# Patient Record
Sex: Male | Born: 1960 | Race: White | Hispanic: No | Smoking: Former smoker
Health system: Southern US, Community
[De-identification: ages and names within clinical notes are randomized; demographics above are authoritative.]

## PROBLEM LIST (undated history)

## (undated) DIAGNOSIS — I7101 Dissection of thoracic aorta: Secondary | ICD-10-CM

## (undated) DIAGNOSIS — I10 Essential (primary) hypertension: Secondary | ICD-10-CM

## (undated) DIAGNOSIS — Z8709 Personal history of other diseases of the respiratory system: Secondary | ICD-10-CM

## (undated) DIAGNOSIS — R1310 Dysphagia, unspecified: Secondary | ICD-10-CM

## (undated) DIAGNOSIS — H269 Unspecified cataract: Secondary | ICD-10-CM

## (undated) HISTORY — DX: Unspecified cataract: H26.9

## (undated) HISTORY — DX: Personal history of other diseases of the respiratory system: Z87.09

## (undated) HISTORY — PX: EYE SURGERY: SHX253

## (undated) HISTORY — DX: Dysphagia, unspecified: R13.10

---
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
## 2019-03-24 ENCOUNTER — Emergency Department (HOSPITAL_COMMUNITY): Payer: Self-pay | Admitting: Certified Registered Nurse Anesthetist

## 2019-03-24 ENCOUNTER — Emergency Department (HOSPITAL_COMMUNITY): Payer: Self-pay

## 2019-03-24 ENCOUNTER — Inpatient Hospital Stay (HOSPITAL_COMMUNITY): Payer: Self-pay

## 2019-03-24 ENCOUNTER — Inpatient Hospital Stay: Admit: 2019-03-24 | Payer: Self-pay | Admitting: Thoracic Surgery (Cardiothoracic Vascular Surgery)

## 2019-03-24 ENCOUNTER — Inpatient Hospital Stay (HOSPITAL_COMMUNITY): Payer: Self-pay | Admitting: Anesthesiology

## 2019-03-24 ENCOUNTER — Inpatient Hospital Stay (HOSPITAL_COMMUNITY)
Admission: EM | Admit: 2019-03-24 | Discharge: 2019-04-02 | DRG: 220 | Disposition: A | Discharge status: Home / Self Care | Payer: Self-pay | Attending: Thoracic Surgery (Cardiothoracic Vascular Surgery) | Admitting: Thoracic Surgery (Cardiothoracic Vascular Surgery)

## 2019-03-24 ENCOUNTER — Other Ambulatory Visit: Payer: Self-pay

## 2019-03-24 ENCOUNTER — Encounter (HOSPITAL_COMMUNITY)
Admission: EM | Disposition: A | Discharge status: Home / Self Care | Payer: Self-pay | Source: Home / Self Care | Attending: Emergency Medicine

## 2019-03-24 ENCOUNTER — Encounter (HOSPITAL_COMMUNITY)
Admission: EM | Disposition: A | Discharge status: Home / Self Care | Payer: Self-pay | Source: Home / Self Care | Attending: Thoracic Surgery (Cardiothoracic Vascular Surgery)

## 2019-03-24 ENCOUNTER — Emergency Department (HOSPITAL_COMMUNITY)
Admission: EM | Admit: 2019-03-24 | Discharge: 2019-03-24 | Disposition: A | Discharge status: Home / Self Care | Payer: Self-pay | Attending: Emergency Medicine | Admitting: Emergency Medicine

## 2019-03-24 ENCOUNTER — Encounter (HOSPITAL_COMMUNITY): Payer: Self-pay | Admitting: Emergency Medicine

## 2019-03-24 DIAGNOSIS — I7103 Dissection of thoracoabdominal aorta: Secondary | ICD-10-CM

## 2019-03-24 DIAGNOSIS — D62 Acute posthemorrhagic anemia: Secondary | ICD-10-CM | POA: Diagnosis not present

## 2019-03-24 DIAGNOSIS — Z6833 Body mass index (BMI) 33.0-33.9, adult: Secondary | ICD-10-CM

## 2019-03-24 DIAGNOSIS — E8770 Fluid overload, unspecified: Secondary | ICD-10-CM | POA: Diagnosis present

## 2019-03-24 DIAGNOSIS — E876 Hypokalemia: Secondary | ICD-10-CM | POA: Diagnosis not present

## 2019-03-24 DIAGNOSIS — R079 Chest pain, unspecified: Secondary | ICD-10-CM

## 2019-03-24 DIAGNOSIS — Z87442 Personal history of urinary calculi: Secondary | ICD-10-CM

## 2019-03-24 DIAGNOSIS — Z8249 Family history of ischemic heart disease and other diseases of the circulatory system: Secondary | ICD-10-CM

## 2019-03-24 DIAGNOSIS — I119 Hypertensive heart disease without heart failure: Secondary | ICD-10-CM | POA: Diagnosis present

## 2019-03-24 DIAGNOSIS — I7101 Dissection of thoracic aorta: Secondary | ICD-10-CM

## 2019-03-24 DIAGNOSIS — K5900 Constipation, unspecified: Secondary | ICD-10-CM | POA: Diagnosis present

## 2019-03-24 DIAGNOSIS — I71019 Dissection of thoracic aorta, unspecified: Secondary | ICD-10-CM | POA: Diagnosis present

## 2019-03-24 DIAGNOSIS — I959 Hypotension, unspecified: Secondary | ICD-10-CM

## 2019-03-24 DIAGNOSIS — I454 Nonspecific intraventricular block: Secondary | ICD-10-CM | POA: Diagnosis present

## 2019-03-24 DIAGNOSIS — J90 Pleural effusion, not elsewhere classified: Secondary | ICD-10-CM

## 2019-03-24 DIAGNOSIS — Z9889 Other specified postprocedural states: Secondary | ICD-10-CM

## 2019-03-24 DIAGNOSIS — Z5321 Procedure and treatment not carried out due to patient leaving prior to being seen by health care provider: Secondary | ICD-10-CM | POA: Insufficient documentation

## 2019-03-24 DIAGNOSIS — K219 Gastro-esophageal reflux disease without esophagitis: Secondary | ICD-10-CM | POA: Diagnosis present

## 2019-03-24 DIAGNOSIS — Z8261 Family history of arthritis: Secondary | ICD-10-CM

## 2019-03-24 DIAGNOSIS — I7102 Dissection of abdominal aorta: Secondary | ICD-10-CM

## 2019-03-24 DIAGNOSIS — R001 Bradycardia, unspecified: Secondary | ICD-10-CM

## 2019-03-24 DIAGNOSIS — E669 Obesity, unspecified: Secondary | ICD-10-CM | POA: Diagnosis present

## 2019-03-24 DIAGNOSIS — Z79899 Other long term (current) drug therapy: Secondary | ICD-10-CM

## 2019-03-24 HISTORY — DX: Dissection of thoracic aorta, unspecified: I71.019

## 2019-03-24 HISTORY — DX: Essential (primary) hypertension: I10

## 2019-03-24 HISTORY — PX: TRANSESOPHAGEAL ECHOCARDIOGRAM: SHX273

## 2019-03-24 HISTORY — DX: Dissection of thoracic aorta: I71.01

## 2019-03-24 HISTORY — PX: THORACIC AORTIC ANEURYSM REPAIR: SHX799

## 2019-03-24 LAB — POCT I-STAT 4, (NA,K, GLUC, HGB,HCT)
Glucose, Bld: 109 mg/dL — ABNORMAL HIGH (ref 70–99)
Glucose, Bld: 113 mg/dL — ABNORMAL HIGH (ref 70–99)
Glucose, Bld: 59 mg/dL — ABNORMAL LOW (ref 70–99)
Glucose, Bld: 157 mg/dL — ABNORMAL HIGH (ref 70–99)
Glucose, Bld: 175 mg/dL — ABNORMAL HIGH (ref 70–99)
Glucose, Bld: 176 mg/dL — ABNORMAL HIGH (ref 70–99)
HCT: 18 % — ABNORMAL LOW (ref 39.0–52.0)
HCT: 33 % — ABNORMAL LOW (ref 39.0–52.0)
HCT: 40 % (ref 39.0–52.0)
HCT: 29 % — ABNORMAL LOW (ref 39.0–52.0)
HCT: 26 % — ABNORMAL LOW (ref 39.0–52.0)
HCT: 28 % — ABNORMAL LOW (ref 39.0–52.0)
Hemoglobin: 11.2 g/dL — ABNORMAL LOW (ref 13.0–17.0)
Hemoglobin: 13.6 g/dL (ref 13.0–17.0)
Hemoglobin: 6.1 g/dL — CL (ref 13.0–17.0)
Hemoglobin: 9.9 g/dL — ABNORMAL LOW (ref 13.0–17.0)
Hemoglobin: 8.8 g/dL — ABNORMAL LOW (ref 13.0–17.0)
Hemoglobin: 9.5 g/dL — ABNORMAL LOW (ref 13.0–17.0)
Potassium: 3.8 mmol/L (ref 3.5–5.1)
Potassium: 4.1 mmol/L (ref 3.5–5.1)
Potassium: 4.5 mmol/L (ref 3.5–5.1)
Potassium: 4.3 mmol/L (ref 3.5–5.1)
Potassium: 5.2 mmol/L — ABNORMAL HIGH (ref 3.5–5.1)
Potassium: 5.1 mmol/L (ref 3.5–5.1)
Sodium: 138 mmol/L (ref 135–145)
Sodium: 141 mmol/L (ref 135–145)
Sodium: 142 mmol/L (ref 135–145)
Sodium: 137 mmol/L (ref 135–145)
Sodium: 138 mmol/L (ref 135–145)
Sodium: 137 mmol/L (ref 135–145)

## 2019-03-24 LAB — CBC WITH DIFFERENTIAL/PLATELET

## 2019-03-24 LAB — I-STAT CHEM 8, ED
BUN: 12 mg/dL (ref 6–20)
Calcium, Ion: 1.06 mmol/L — ABNORMAL LOW (ref 1.15–1.40)
Chloride: 106 mmol/L (ref 98–111)
Creatinine, Ser: 1.2 mg/dL (ref 0.61–1.24)
Glucose, Bld: 106 mg/dL — ABNORMAL HIGH (ref 70–99)
HCT: 39 % (ref 39.0–52.0)
Hemoglobin: 13.3 g/dL (ref 13.0–17.0)
Potassium: 3.3 mmol/L — ABNORMAL LOW (ref 3.5–5.1)
Sodium: 143 mmol/L (ref 135–145)
TCO2: 23 mmol/L (ref 22–32)

## 2019-03-24 LAB — POCT I-STAT 7, (LYTES, BLD GAS, ICA,H+H)
Acid-base deficit: 1 mmol/L (ref 0.0–2.0)
Acid-base deficit: 8 mmol/L — ABNORMAL HIGH (ref 0.0–2.0)
Acid-base deficit: 4 mmol/L — ABNORMAL HIGH (ref 0.0–2.0)
Bicarbonate: 26.1 mmol/L (ref 20.0–28.0)
Bicarbonate: 20.4 mmol/L (ref 20.0–28.0)
Bicarbonate: 22.5 mmol/L (ref 20.0–28.0)
Calcium, Ion: 1.05 mmol/L — ABNORMAL LOW (ref 1.15–1.40)
Calcium, Ion: 1.06 mmol/L — ABNORMAL LOW (ref 1.15–1.40)
Calcium, Ion: 0.93 mmol/L — ABNORMAL LOW (ref 1.15–1.40)
HCT: 34 % — ABNORMAL LOW (ref 39.0–52.0)
HCT: 29 % — ABNORMAL LOW (ref 39.0–52.0)
HCT: 29 % — ABNORMAL LOW (ref 39.0–52.0)
Hemoglobin: 11.6 g/dL — ABNORMAL LOW (ref 13.0–17.0)
Hemoglobin: 9.9 g/dL — ABNORMAL LOW (ref 13.0–17.0)
Hemoglobin: 9.9 g/dL — ABNORMAL LOW (ref 13.0–17.0)
O2 Saturation: 100 %
O2 Saturation: 100 %
O2 Saturation: 99 %
Potassium: 3.9 mmol/L (ref 3.5–5.1)
Potassium: 4.4 mmol/L (ref 3.5–5.1)
Potassium: 5.1 mmol/L (ref 3.5–5.1)
Sodium: 142 mmol/L (ref 135–145)
Sodium: 138 mmol/L (ref 135–145)
Sodium: 137 mmol/L (ref 135–145)
TCO2: 28 mmol/L (ref 22–32)
TCO2: 22 mmol/L (ref 22–32)
TCO2: 24 mmol/L (ref 22–32)
pCO2 arterial: 55.5 mmHg — ABNORMAL HIGH (ref 32.0–48.0)
pCO2 arterial: 54.5 mmHg — ABNORMAL HIGH (ref 32.0–48.0)
pCO2 arterial: 49.2 mmHg — ABNORMAL HIGH (ref 32.0–48.0)
pH, Arterial: 7.28 — ABNORMAL LOW (ref 7.350–7.450)
pH, Arterial: 7.182 — CL (ref 7.350–7.450)
pH, Arterial: 7.267 — ABNORMAL LOW (ref 7.350–7.450)
pO2, Arterial: 338 mmHg — ABNORMAL HIGH (ref 83.0–108.0)
pO2, Arterial: 326 mmHg — ABNORMAL HIGH (ref 83.0–108.0)
pO2, Arterial: 166 mmHg — ABNORMAL HIGH (ref 83.0–108.0)

## 2019-03-24 LAB — TROPONIN I (HIGH SENSITIVITY)

## 2019-03-24 LAB — HEMOGLOBIN AND HEMATOCRIT, BLOOD
HCT: 31.4 % — ABNORMAL LOW (ref 39.0–52.0)
Hemoglobin: 10.5 g/dL — ABNORMAL LOW (ref 13.0–17.0)

## 2019-03-24 LAB — ABO/RH: ABO/RH(D): A POS

## 2019-03-24 LAB — MAGNESIUM

## 2019-03-24 LAB — BASIC METABOLIC PANEL

## 2019-03-24 LAB — PROTIME-INR

## 2019-03-24 LAB — PLATELET COUNT: Platelets: 117 10*3/uL — ABNORMAL LOW (ref 150–400)

## 2019-03-24 LAB — SARS CORONAVIRUS 2 BY RT PCR (HOSPITAL ORDER, PERFORMED IN ~~LOC~~ HOSPITAL LAB)

## 2019-03-24 LAB — FIBRINOGEN: Fibrinogen: 122 mg/dL — ABNORMAL LOW (ref 210–475)

## 2019-03-24 SURGERY — REPAIR, ANEURYSM, AORTA, THORACIC, ASCENDING
Anesthesia: General

## 2019-03-24 MED ORDER — PROTAMINE SULFATE 10 MG/ML IV SOLN
INTRAVENOUS | Status: DC | PRN
  Administered 2019-03-24: 10 mg via INTRAVENOUS
  Administered 2019-03-24: 100 mg via INTRAVENOUS
  Administered 2019-03-24: 40 mg via INTRAVENOUS
  Administered 2019-03-24 (×2): 100 mg via INTRAVENOUS

## 2019-03-24 MED ORDER — SUCCINYLCHOLINE 20MG/ML (10ML) SYRINGE FOR MEDFUSION PUMP - OPTIME
INTRAMUSCULAR | Status: DC | PRN
  Administered 2019-03-24: 200 mg via INTRAVENOUS

## 2019-03-24 MED ORDER — VANCOMYCIN HCL 10 G IV SOLR
1250.0000 mg | INTRAVENOUS | Status: DC
  Filled 2019-03-24 (×2): qty 1250

## 2019-03-24 MED ORDER — VANCOMYCIN HCL 1000 MG IV SOLR
INTRAVENOUS | Status: DC | PRN
  Administered 2019-03-24: 1500 mg via INTRAVENOUS

## 2019-03-24 MED ORDER — METHYLPREDNISOLONE SODIUM SUCC 125 MG IJ SOLR
125.0000 mg | Freq: Once | INTRAMUSCULAR | Status: DC
  Filled 2019-03-24: qty 2

## 2019-03-24 MED ORDER — TRANEXAMIC ACID 1000 MG/10ML IV SOLN
1.5000 mg/kg/h | INTRAVENOUS | Status: AC
  Administered 2019-03-24: 19:00:00 1.5 mg/kg/h via INTRAVENOUS
  Administered 2019-03-24: 22:00:00 via INTRAVENOUS
  Filled 2019-03-24: qty 25

## 2019-03-24 MED ORDER — MANNITOL 20 % IV SOLN
Freq: Once | INTRAVENOUS | Status: DC
  Filled 2019-03-24: qty 13

## 2019-03-24 MED ORDER — MIDAZOLAM HCL 5 MG/5ML IJ SOLN: INTRAMUSCULAR | Status: DC | PRN

## 2019-03-24 MED ORDER — SODIUM CHLORIDE 0.9 % IV SOLN
INTRAVENOUS | Status: DC
  Filled 2019-03-24: qty 30

## 2019-03-24 MED ORDER — SODIUM CHLORIDE 0.9 % IV SOLN
750.0000 mg | INTRAVENOUS | Status: DC
  Filled 2019-03-24 (×2): qty 750

## 2019-03-24 MED ORDER — DEXMEDETOMIDINE HCL IN NACL 400 MCG/100ML IV SOLN
0.1000 ug/kg/h | INTRAVENOUS | Status: DC
  Filled 2019-03-24: qty 100

## 2019-03-24 MED ORDER — LACTATED RINGERS IV SOLN
INTRAVENOUS | Status: DC | PRN
  Administered 2019-03-24: 18:00:00 via INTRAVENOUS

## 2019-03-24 MED ORDER — PLASMA-LYTE 148 IV SOLN
INTRAVENOUS | Status: DC
  Filled 2019-03-24: qty 2.5

## 2019-03-24 MED ORDER — LACTATED RINGERS IV SOLN: INTRAVENOUS | Status: DC | PRN

## 2019-03-24 MED ORDER — SODIUM CHLORIDE 0.9 % IV BOLUS
1000.0000 mL | Freq: Once | INTRAVENOUS | Status: AC
  Administered 2019-03-24: 1000 mL via INTRAVENOUS

## 2019-03-24 MED ORDER — MIDAZOLAM HCL 2 MG/2ML IJ SOLN
INTRAMUSCULAR | Status: DC | PRN
  Administered 2019-03-24 (×2): 2 mg via INTRAVENOUS
  Administered 2019-03-24 (×2): 1 mg via INTRAVENOUS
  Administered 2019-03-25: 2 mg via INTRAVENOUS

## 2019-03-24 MED ORDER — IOHEXOL 350 MG/ML SOLN
100.0000 mL | Freq: Once | INTRAVENOUS | Status: AC | PRN
  Administered 2019-03-24: 100 mL via INTRAVENOUS

## 2019-03-24 MED ORDER — HEMOSTATIC AGENTS (NO CHARGE) OPTIME
TOPICAL | Status: DC | PRN
  Administered 2019-03-24: 1 via TOPICAL

## 2019-03-24 MED ORDER — ROCURONIUM BROMIDE 50 MG/5ML IV SOSY: PREFILLED_SYRINGE | INTRAVENOUS | Status: DC | PRN

## 2019-03-24 MED ORDER — HEPARIN SODIUM (PORCINE) 1000 UNIT/ML IJ SOLN
INTRAMUSCULAR | Status: DC | PRN
  Administered 2019-03-24: 30000 [IU] via INTRAVENOUS
  Administered 2019-03-24: 5000 [IU] via INTRAVENOUS

## 2019-03-24 MED ORDER — DOPAMINE-DEXTROSE 3.2-5 MG/ML-% IV SOLN
0.0000 ug/kg/min | INTRAVENOUS | Status: DC
  Filled 2019-03-24: qty 250

## 2019-03-24 MED ORDER — PROPOFOL 10 MG/ML IV BOLUS
INTRAVENOUS | Status: DC | PRN
  Administered 2019-03-24: 100 mg via INTRAVENOUS

## 2019-03-24 MED ORDER — SODIUM CHLORIDE 0.9 % IV SOLN
1.5000 g | INTRAVENOUS | Status: DC
  Filled 2019-03-24: qty 1.5

## 2019-03-24 MED ORDER — ETOMIDATE 2 MG/ML IV SOLN
INTRAVENOUS | Status: DC | PRN
  Administered 2019-03-24: 16 mg via INTRAVENOUS

## 2019-03-24 MED ORDER — POTASSIUM CHLORIDE 2 MEQ/ML IV SOLN
80.0000 meq | INTRAVENOUS | Status: DC
  Filled 2019-03-24: qty 40

## 2019-03-24 MED ORDER — TRANEXAMIC ACID 1000 MG/10ML IV SOLN
1.5000 mg/kg/h | INTRAVENOUS | Status: DC
  Filled 2019-03-24: qty 25

## 2019-03-24 MED ORDER — TRANEXAMIC ACID (OHS) PUMP PRIME SOLUTION
2.0000 mg/kg | INTRAVENOUS | Status: DC
  Filled 2019-03-24: qty 2.36

## 2019-03-24 MED ORDER — MILRINONE LACTATE IN DEXTROSE 20-5 MG/100ML-% IV SOLN
0.3000 ug/kg/min | INTRAVENOUS | Status: DC
  Filled 2019-03-24: qty 100

## 2019-03-24 MED ORDER — NITROGLYCERIN IN D5W 200-5 MCG/ML-% IV SOLN
2.0000 ug/min | INTRAVENOUS | Status: DC
  Filled 2019-03-24: qty 250

## 2019-03-24 MED ORDER — EPINEPHRINE HCL 5 MG/250ML IV SOLN IN NS
0.0000 ug/min | INTRAVENOUS | Status: DC
  Filled 2019-03-24: qty 250

## 2019-03-24 MED ORDER — MIDAZOLAM HCL 2 MG/2ML IJ SOLN
INTRAMUSCULAR | Status: AC
  Filled 2019-03-24: qty 2

## 2019-03-24 MED ORDER — HEPARIN SODIUM (PORCINE) 1000 UNIT/ML IJ SOLN: INTRAMUSCULAR | Status: DC | PRN

## 2019-03-24 MED ORDER — 0.9 % SODIUM CHLORIDE (POUR BTL) OPTIME
TOPICAL | Status: DC | PRN
  Administered 2019-03-24: 5000 mL

## 2019-03-24 MED ORDER — TRANEXAMIC ACID (OHS) BOLUS VIA INFUSION
15.0000 mg/kg | INTRAVENOUS | Status: AC
  Administered 2019-03-24: 19:00:00 1768.5 mg via INTRAVENOUS
  Filled 2019-03-24: qty 1769

## 2019-03-24 MED ORDER — ROCURONIUM BROMIDE 100 MG/10ML IV SOLN
INTRAVENOUS | Status: DC | PRN
  Administered 2019-03-24: 50 mg via INTRAVENOUS
  Administered 2019-03-24: 100 mg via INTRAVENOUS
  Administered 2019-03-24: 30 mg via INTRAVENOUS

## 2019-03-24 MED ORDER — SODIUM CHLORIDE 0.9 % IV SOLN
INTRAVENOUS | Status: DC | PRN
  Administered 2019-03-24: 23:00:00 .75 g via INTRAVENOUS
  Administered 2019-03-24: 19:00:00 1.5 g via INTRAVENOUS

## 2019-03-24 MED ORDER — TRANEXAMIC ACID (OHS) BOLUS VIA INFUSION
15.0000 mg/kg | INTRAVENOUS | Status: DC
  Filled 2019-03-24: qty 1769

## 2019-03-24 MED ORDER — ETOMIDATE 2 MG/ML IV SOLN: INTRAVENOUS | Status: DC | PRN

## 2019-03-24 MED ORDER — MAGNESIUM SULFATE 50 % IJ SOLN
40.0000 meq | INTRAMUSCULAR | Status: DC
  Filled 2019-03-24: qty 9.85

## 2019-03-24 MED ORDER — DEXMEDETOMIDINE HCL IN NACL 400 MCG/100ML IV SOLN
0.1000 ug/kg/h | INTRAVENOUS | Status: AC
  Administered 2019-03-24: .2 ug/kg/h via INTRAVENOUS
  Filled 2019-03-24: qty 100

## 2019-03-24 MED ORDER — PLASMA-LYTE 148 IV SOLN
INTRAVENOUS | Status: AC
  Administered 2019-03-24: 20:00:00 500 mL
  Filled 2019-03-24: qty 2.5

## 2019-03-24 MED ORDER — ONDANSETRON HCL 4 MG/2ML IJ SOLN
INTRAMUSCULAR | Status: AC
  Administered 2019-03-24: 17:00:00 4 mg via INTRAVENOUS
  Filled 2019-03-24: qty 2

## 2019-03-24 MED ORDER — SODIUM CHLORIDE (PF) 0.9 % IJ SOLN
OROMUCOSAL | Status: DC | PRN
  Administered 2019-03-24 (×3): 4 mL via TOPICAL

## 2019-03-24 MED ORDER — INSULIN REGULAR(HUMAN) IN NACL 100-0.9 UT/100ML-% IV SOLN
INTRAVENOUS | Status: DC
  Filled 2019-03-24: qty 100

## 2019-03-24 MED ORDER — FENTANYL CITRATE (PF) 250 MCG/5ML IJ SOLN
INTRAMUSCULAR | Status: AC
  Filled 2019-03-24: qty 10

## 2019-03-24 MED ORDER — FENTANYL CITRATE (PF) 100 MCG/2ML IJ SOLN: INTRAMUSCULAR | Status: DC | PRN

## 2019-03-24 MED ORDER — PHENYLEPHRINE HCL-NACL 20-0.9 MG/250ML-% IV SOLN
30.0000 ug/min | INTRAVENOUS | Status: DC
  Filled 2019-03-24: qty 250

## 2019-03-24 MED ORDER — NITROGLYCERIN IN D5W 200-5 MCG/ML-% IV SOLN
2.0000 ug/min | INTRAVENOUS | Status: AC
  Administered 2019-03-24: 22:00:00 5 ug/min via INTRAVENOUS
  Filled 2019-03-24: qty 250

## 2019-03-24 MED ORDER — PHENYLEPHRINE HCL-NACL 20-0.9 MG/250ML-% IV SOLN
30.0000 ug/min | INTRAVENOUS | Status: DC
  Filled 2019-03-24 (×2): qty 250

## 2019-03-24 MED ORDER — FENTANYL CITRATE (PF) 250 MCG/5ML IJ SOLN
INTRAMUSCULAR | Status: DC | PRN
  Administered 2019-03-24: 150 ug via INTRAVENOUS
  Administered 2019-03-24: 100 ug via INTRAVENOUS
  Administered 2019-03-24: 225 ug via INTRAVENOUS
  Administered 2019-03-24: 100 ug via INTRAVENOUS
  Administered 2019-03-24: 150 ug via INTRAVENOUS
  Administered 2019-03-24: 125 ug via INTRAVENOUS

## 2019-03-24 MED ORDER — ATROPINE SULFATE 1 MG/10ML IJ SOSY
0.5000 mg | PREFILLED_SYRINGE | Freq: Once | INTRAMUSCULAR | Status: AC
  Administered 2019-03-24: 0.5 mg via INTRAVENOUS
  Filled 2019-03-24: qty 10

## 2019-03-24 MED ORDER — DOPAMINE-DEXTROSE 3.2-5 MG/ML-% IV SOLN: 0.0000 ug/kg/min | INTRAVENOUS | Status: DC

## 2019-03-24 MED ORDER — SUCCINYLCHOLINE CHLORIDE 200 MG/10ML IV SOSY: PREFILLED_SYRINGE | INTRAVENOUS | Status: DC | PRN

## 2019-03-24 MED ORDER — VANCOMYCIN HCL 10 G IV SOLR
1500.0000 mg | INTRAVENOUS | Status: DC
  Filled 2019-03-24: qty 1500

## 2019-03-24 MED ORDER — SODIUM CHLORIDE 0.9 % IV SOLN: INTRAVENOUS | Status: DC | PRN

## 2019-03-24 MED ORDER — ATROPINE SULFATE 1 MG/10ML IJ SOSY
PREFILLED_SYRINGE | INTRAMUSCULAR | Status: AC
  Administered 2019-03-24: 16:00:00 0.5 mg via INTRAVENOUS
  Filled 2019-03-24: qty 10

## 2019-03-24 MED ORDER — TRANEXAMIC ACID (OHS) BOLUS VIA INFUSION
15.0000 mg/kg | INTRAVENOUS | Status: DC
  Filled 2019-03-24: qty 1095

## 2019-03-24 MED ORDER — TRANEXAMIC ACID (OHS) PUMP PRIME SOLUTION
2.0000 mg/kg | INTRAVENOUS | Status: DC
  Filled 2019-03-24: qty 1.46

## 2019-03-24 MED ORDER — INSULIN REGULAR(HUMAN) IN NACL 100-0.9 UT/100ML-% IV SOLN
INTRAVENOUS | Status: AC
  Administered 2019-03-24: 20:00:00 1 [IU]/h via INTRAVENOUS
  Filled 2019-03-24: qty 100

## 2019-03-24 MED ORDER — SODIUM CHLORIDE 0.9 % IV SOLN
INTRAVENOUS | Status: DC | PRN
  Administered 2019-03-24: 19:00:00 50 ug/min via INTRAVENOUS
  Administered 2019-03-24: 19:00:00 40 ug/min via INTRAVENOUS

## 2019-03-24 SURGICAL SUPPLY — 116 items
ADAPTER CARDIO PERF ANTE/RETRO (ADAPTER) ×3 IMPLANT
BAG DECANTER FOR FLEXI CONT (MISCELLANEOUS) ×3 IMPLANT
BASKET HEART  (ORDER IN 25'S) (MISCELLANEOUS)
BASKET HEART (ORDER IN 25'S) (MISCELLANEOUS)
BASKET HEART (ORDER IN 25S) (MISCELLANEOUS) IMPLANT
BLADE CLIPPER SURG (BLADE) ×6 IMPLANT
BLADE STERNUM SYSTEM 6 (BLADE) ×3 IMPLANT
BNDG ELASTIC 4X5.8 VLCR STR LF (GAUZE/BANDAGES/DRESSINGS) IMPLANT
BNDG ELASTIC 6X5.8 VLCR STR LF (GAUZE/BANDAGES/DRESSINGS) IMPLANT
BNDG GAUZE ELAST 4 BULKY (GAUZE/BANDAGES/DRESSINGS) IMPLANT
CANISTER SUCT 3000ML PPV (MISCELLANEOUS) ×3 IMPLANT
CANNULA EZ GLIDE AORTIC 21FR (CANNULA) IMPLANT
CANNULA GUNDRY RCSP 15FR (MISCELLANEOUS) ×3 IMPLANT
CANNULA OPTISITE PERFUSION 22F (CANNULA) ×3 IMPLANT
CANNULA SUMP PERICARDIAL (CANNULA) ×6 IMPLANT
CATH CPB KIT HENDRICKSON (MISCELLANEOUS) ×3 IMPLANT
CATH HEART VENT LEFT (CATHETERS) ×1 IMPLANT
CATH ROBINSON RED A/P 18FR (CATHETERS) ×9 IMPLANT
CLIP RETRACTION 3.0MM CORONARY (MISCELLANEOUS) IMPLANT
CLIP VESOCCLUDE LG 6/CT (CLIP) ×3 IMPLANT
CLIP VESOCCLUDE MED 24/CT (CLIP) IMPLANT
CLIP VESOCCLUDE SM WIDE 24/CT (CLIP) IMPLANT
CONNECTOR BLAKE 2:1 CARIO BLK (MISCELLANEOUS) ×3 IMPLANT
CONT SPEC 4OZ CLIKSEAL STRL BL (MISCELLANEOUS) ×3 IMPLANT
COVER MAYO STAND STRL (DRAPES) ×3 IMPLANT
COVER WAND RF STERILE (DRAPES) IMPLANT
DRAIN CHANNEL 19F RND (DRAIN) ×6 IMPLANT
DRAPE CARDIOVASCULAR INCISE (DRAPES) ×2
DRAPE INCISE IOBAN 66X45 STRL (DRAPES) ×3 IMPLANT
DRAPE SLUSH/WARMER DISC (DRAPES) ×3 IMPLANT
DRAPE SRG 135X102X78XABS (DRAPES) ×1 IMPLANT
DRSG AQUACEL AG ADV 3.5X 6 (GAUZE/BANDAGES/DRESSINGS) ×3 IMPLANT
DRSG AQUACEL AG ADV 3.5X14 (GAUZE/BANDAGES/DRESSINGS) ×3 IMPLANT
DRSG COVADERM 4X14 (GAUZE/BANDAGES/DRESSINGS) IMPLANT
ELECT REM PT RETURN 9FT ADLT (ELECTROSURGICAL) ×6
ELECTRODE REM PT RTRN 9FT ADLT (ELECTROSURGICAL) ×2 IMPLANT
FELT TEFLON 1X6 (MISCELLANEOUS) ×6 IMPLANT
GAUZE SPONGE 4X4 12PLY STRL (GAUZE/BANDAGES/DRESSINGS) ×3 IMPLANT
GLOVE BIO SURGEON STRL SZ 6.5 (GLOVE) ×4 IMPLANT
GLOVE BIO SURGEON STRL SZ7 (GLOVE) IMPLANT
GLOVE BIO SURGEON STRL SZ7.5 (GLOVE) ×6 IMPLANT
GLOVE BIO SURGEONS STRL SZ 6.5 (GLOVE) ×2
GLOVE BIOGEL PI IND STRL 6 (GLOVE) ×2 IMPLANT
GLOVE BIOGEL PI IND STRL 6.5 (GLOVE) ×4 IMPLANT
GLOVE BIOGEL PI IND STRL 7.5 (GLOVE) IMPLANT
GLOVE BIOGEL PI INDICATOR 6 (GLOVE) ×4
GLOVE BIOGEL PI INDICATOR 6.5 (GLOVE) ×8
GLOVE BIOGEL PI INDICATOR 7.5 (GLOVE)
GOWN STRL REUS W/ TWL LRG LVL3 (GOWN DISPOSABLE) ×6 IMPLANT
GOWN STRL REUS W/ TWL XL LVL3 (GOWN DISPOSABLE) ×1 IMPLANT
GOWN STRL REUS W/TWL LRG LVL3 (GOWN DISPOSABLE) ×12
GOWN STRL REUS W/TWL XL LVL3 (GOWN DISPOSABLE) ×2
GRAFT CV 30X8WVN NDL (Graft) ×1 IMPLANT
GRAFT HEMASHIELD 8MM (Graft) ×2 IMPLANT
GRAFT WOVEN D/V 28DX30L (Vascular Products) ×3 IMPLANT
HEMOSTAT POWDER SURGIFOAM 1G (HEMOSTASIS) ×9 IMPLANT
INSERT FOGARTY SM (MISCELLANEOUS) ×6 IMPLANT
INSERT FOGARTY XLG (MISCELLANEOUS) ×3 IMPLANT
KIT BASIN OR (CUSTOM PROCEDURE TRAY) ×3 IMPLANT
KIT SUCTION CATH 14FR (SUCTIONS) ×3 IMPLANT
KIT TURNOVER KIT B (KITS) ×3 IMPLANT
KIT VASOVIEW HEMOPRO 2 VH 4000 (KITS) IMPLANT
LEAD PACING MYOCARDI (MISCELLANEOUS) ×3 IMPLANT
LINE VENT (MISCELLANEOUS) ×3 IMPLANT
MARKER GRAFT CORONARY BYPASS (MISCELLANEOUS) IMPLANT
NS IRRIG 1000ML POUR BTL (IV SOLUTION) ×15 IMPLANT
PACK E OPEN HEART (SUTURE) ×3 IMPLANT
PACK OPEN HEART (CUSTOM PROCEDURE TRAY) ×3 IMPLANT
PAD ARMBOARD 7.5X6 YLW CONV (MISCELLANEOUS) ×6 IMPLANT
PAD ELECT DEFIB RADIOL ZOLL (MISCELLANEOUS) ×3 IMPLANT
PENCIL BUTTON HOLSTER BLD 10FT (ELECTRODE) IMPLANT
POSITIONER HEAD DONUT 9IN (MISCELLANEOUS) ×3 IMPLANT
PUNCH AORTIC ROTATE 4.0MM (MISCELLANEOUS) IMPLANT
PUNCH AORTIC ROTATE 4.5MM 8IN (MISCELLANEOUS) IMPLANT
PUNCH AORTIC ROTATE 5MM 8IN (MISCELLANEOUS) IMPLANT
SET CARDIOPLEGIA MPS 5001102 (MISCELLANEOUS) ×3 IMPLANT
SET MICROPUNCTURE 5F STIFF (MISCELLANEOUS) ×3 IMPLANT
SPONGE LAP 18X18 RF (DISPOSABLE) IMPLANT
SPONGE LAP 18X18 X RAY DECT (DISPOSABLE) ×3 IMPLANT
SPONGE LAP 4X18 RFD (DISPOSABLE) IMPLANT
STOPCOCK 4 WAY LG BORE MALE ST (IV SETS) ×3 IMPLANT
SUT BONE WAX W31G (SUTURE) ×3 IMPLANT
SUT ETHIBOND X763 2 0 SH 1 (SUTURE) ×3 IMPLANT
SUT MNCRL AB 3-0 PS2 18 (SUTURE) ×9 IMPLANT
SUT PDS AB 1 CTX 36 (SUTURE) ×6 IMPLANT
SUT PROLENE 2 0 SH DA (SUTURE) IMPLANT
SUT PROLENE 3 0 SH DA (SUTURE) ×9 IMPLANT
SUT PROLENE 3 0 SH1 36 (SUTURE) IMPLANT
SUT PROLENE 4 0 RB 1 (SUTURE) ×46
SUT PROLENE 4 0 SH DA (SUTURE) ×3 IMPLANT
SUT PROLENE 4-0 RB1 .5 CRCL 36 (SUTURE) ×23 IMPLANT
SUT PROLENE 5 0 C 1 36 (SUTURE) IMPLANT
SUT PROLENE 6 0 C 1 30 (SUTURE) IMPLANT
SUT PROLENE 8 0 BV175 6 (SUTURE) IMPLANT
SUT PROLENE BLUE 7 0 (SUTURE) IMPLANT
SUT PROLENE POLY MONO (SUTURE) IMPLANT
SUT STEEL 6MS V (SUTURE) ×6 IMPLANT
SUT STEEL STERNAL CCS#1 18IN (SUTURE) ×3 IMPLANT
SUT STEEL SZ 6 DBL 3X14 BALL (SUTURE) ×6 IMPLANT
SUT VIC AB 2-0 CT1 27 (SUTURE) ×2
SUT VIC AB 2-0 CT1 TAPERPNT 27 (SUTURE) ×1 IMPLANT
SYR 10ML KIT SKIN ADHESIVE (MISCELLANEOUS) IMPLANT
SYSTEM SAHARA CHEST DRAIN ATS (WOUND CARE) ×3 IMPLANT
TAPE CLOTH SURG 4X10 WHT LF (GAUZE/BANDAGES/DRESSINGS) ×3 IMPLANT
TOWEL GREEN STERILE (TOWEL DISPOSABLE) ×3 IMPLANT
TOWEL GREEN STERILE FF (TOWEL DISPOSABLE) ×3 IMPLANT
TRAY CATH LUMEN 1 20CM STRL (SET/KITS/TRAYS/PACK) ×3 IMPLANT
TRAY FOLEY SLVR 16FR TEMP STAT (SET/KITS/TRAYS/PACK) ×3 IMPLANT
TUBE CONNECTING 12'X1/4 (SUCTIONS) ×1
TUBE CONNECTING 12X1/4 (SUCTIONS) ×2 IMPLANT
TUBING ART PRESS 48 MALE/FEM (TUBING) ×6 IMPLANT
TUBING LAP HI FLOW INSUFFLATIO (TUBING) IMPLANT
UNDERPAD 30X30 (UNDERPADS AND DIAPERS) ×3 IMPLANT
VENT LEFT HEART 12002 (CATHETERS) ×3
WATER STERILE IRR 1000ML POUR (IV SOLUTION) ×6 IMPLANT
YANKAUER SUCT BULB TIP NO VENT (SUCTIONS) ×3 IMPLANT

## 2019-03-24 NOTE — Anesthesia Procedure Notes (Deleted)
Central Venous Catheter Insertion Performed by: Roberts Gaudy, MD, anesthesiologist Start/End9/08/2018 6:15 PM, 03/24/2019 6:25 PM Patient location: Pre-op. Preanesthetic checklist: patient identified, IV checked, site marked, risks and benefits discussed, surgical consent, monitors and equipment checked, pre-op evaluation, timeout performed and anesthesia consent Hand hygiene performed  and maximum sterile barriers used  PA cath was placed.MAC introducer Swan type:thermodilution Procedure performed without using ultrasound guided technique. Attempts: 1 Following insertion, line sutured, dressing applied and Biopatch. Post procedure assessment: blood return through all ports, free fluid flow and no air  Patient tolerated the procedure well with no immediate complications.

## 2019-03-24 NOTE — ED Notes (Signed)
CRITICAL VALUE ALERT  Critical Value:  Istat Chem results: Na 143,  K 3.3,  Cl 106, BUN 12, Creatinine Ser 1.20, Glucose 106, CA ION 1.06, TCO2 23, Hgb 13.3,  HCT 39.0  Date & Time Notied:  03/24/2019, 1622  Provider Notified: Dr. Melina Copa  Orders Received/Actions taken: proceed to CT scan

## 2019-03-24 NOTE — ED Notes (Signed)
Sars Coronavirus test results are negative per lab. OR team made aware at [facility].

## 2019-03-24 NOTE — Anesthesia Preprocedure Evaluation (Signed)
Anesthesia Evaluation  Patient identified by MRN, date of birth, ID band Patient awake    Reviewed: Patient's Chart, lab work & pertinent test results, Unable to perform ROS - Chart review onlyPreop documentation limited or incomplete due to emergent nature of procedure.  Airway Mallampati: III  TM Distance: >3 FB Neck ROM: Full    Dental  (+) Poor Dentition, Teeth Intact   Pulmonary    breath sounds clear to auscultation       Cardiovascular hypertension,  Rhythm:Regular Rate:Normal     Neuro/Psych    GI/Hepatic   Endo/Other    Renal/GU      Musculoskeletal   Abdominal (+) + obese,   Peds  Hematology   Anesthesia Other Findings   Reproductive/Obstetrics                             Anesthesia Physical Anesthesia Plan  ASA: IV and emergent  Anesthesia Plan: General   Post-op Pain Management:    Induction: Intravenous  PONV Risk Score and Plan: Ondansetron and Dexamethasone  Airway Management Planned: Oral ETT  Additional Equipment: Arterial line, CVP, PA Cath, 3D TEE and Ultrasound Guidance Line Placement  Intra-op Plan:   Post-operative Plan: Post-operative intubation/ventilation  Informed Consent: I have reviewed the patients History and Physical, chart, labs and discussed the procedure including the risks, benefits and alternatives for the proposed anesthesia with the patient or authorized representative who has indicated his/her understanding and acceptance.       Plan Discussed with: CRNA and Anesthesiologist  Anesthesia Plan Comments:         Anesthesia Quick Evaluation

## 2019-03-24 NOTE — OR Nursing (Signed)
Second call to 2 Heart Charge nurse at [phone].

## 2019-03-24 NOTE — ED Triage Notes (Signed)
Pt c/o sudden onset of generalized weakness, indigestion, and dizziness. EMS reports pt was bradycardic in the 40s upon arrival. Pt given 1L of NS, 324 ASA, and 2 nitro en route.

## 2019-03-24 NOTE — ED Provider Notes (Addendum)
[facility] EMERGENCY DEPARTMENT Provider Note   CSN: [id number] Arrival date & time: 03/24/19  1654     History   Chief Complaint Chief Complaint  Patient presents with   Dizziness   Chest Pain    HPI Jeffrey Flynn is a 58 y.o. male with past medical history of hypertension, presenting to the emergency department via EMS with complaint of dizziness and chest pain that began around 1:30 PM this afternoon.  Patient states he was getting ready to take a bath when he began feeling a tightness sensation in his throat which then radiated down towards his chest.  He also feels some associated nausea.  He waited about 30 minutes prior to calling EMS.  On EMS arrival he was noted to be bradycardic in the 40s with hypertension systolic in the 180s.  He was administered 324 mg of aspirin, 2 sublingual nitroglycerin became hypotensive in route.  He was administered 3 L normal saline prior to arrival.  On arrival he is noted to be significantly hypotensive with BP deficits bilateral upper extremities.  Left upper extremity with blood pressure systolic 87, right upper extremity systolic 57.  Patient is alert and mentating, stating he still feels some discomfort in his chest and feels as though something is wrong.  He states he has been noncompliant with his blood pressure medications for at least 6 months for no particular reason other than he stopped taking the medications.  He has had no prior cardiac events.  He does not have a cardiologist.  He denies infectious symptoms including cough, fever, abdominal complaints.     The history is provided by the patient and the EMS personnel.    Past Medical History:  Diagnosis Date   Hypertension     There are no active problems to display for this patient.   History reviewed. No pertinent surgical history.      Home Medications    Prior to Admission medications   Not on File    Family History No family history on file.  Social  History Social History   Tobacco Use   Smoking status: Not on file  Substance Use Topics   Alcohol use: Not on file   Drug use: Not on file     Allergies   Patient has no known allergies.   Review of Systems Review of Systems  Cardiovascular: Positive for chest pain.  Gastrointestinal: Positive for nausea. Negative for vomiting.  Neurological: Positive for dizziness.  All other systems reviewed and are negative.    Physical Exam Updated Vital Signs BP (!) 98/58    Pulse (!) 58    Resp (!) 22    SpO2 100%   Physical Exam Vitals signs and nursing note reviewed.  Constitutional:      Appearance: He is well-developed. He is obese.     Comments: Pt alert and conversive  HENT:     Head: Normocephalic and atraumatic.  Eyes:     Conjunctiva/sclera: Conjunctivae normal.  Neck:     Musculoskeletal: Normal range of motion and neck supple.  Cardiovascular:     Rate and Rhythm: Regular rhythm. Bradycardia present.     Pulses: Decreased pulses.          Radial pulses are 1+ on the right side and 2+ on the left side.     Comments: Trace pretibial edema BLE Pulmonary:     Effort: Pulmonary effort is normal. No respiratory distress.     Breath sounds: Normal breath sounds.  Abdominal:     General: Bowel sounds are normal.     Palpations: Abdomen is soft.     Tenderness: There is no abdominal tenderness. There is no guarding or rebound.  Skin:    General: Skin is warm.  Neurological:     Mental Status: He is alert.  Psychiatric:        Behavior: Behavior normal.      ED Treatments / Results  Labs (all labs ordered are listed, but only abnormal results are displayed) Labs Reviewed  TYPE AND SCREEN    EKG None  Radiology No results found.  Procedures .Critical Care Performed by: Tysha Grismore, [place] N, PA-C Authorized by: Mirl Hillery, [place] N, PA-C   Critical care provider statement:    Critical care time (minutes):  100   Critical care time was exclusive of:   Separately billable procedures and treating other patients and teaching time   Critical care was necessary to treat or prevent imminent or life-threatening deterioration of the following conditions:  Cardiac failure and circulatory failure   Critical care was time spent personally by me on the following activities:  Discussions with consultants, evaluation of patient's response to treatment, examination of patient, ordering and performing treatments and interventions, ordering and review of laboratory studies, ordering and review of radiographic studies, pulse oximetry, re-evaluation of patient's condition, obtaining history from patient or surrogate and review of old charts   I assumed direction of critical care for this patient from another provider in my specialty: no     (including critical care time)  Medications Ordered in ED Medications  ondansetron (ZOFRAN) 4 MG/2ML injection (has no administration in time range)  atropine 1 MG/10ML injection (has no administration in time range)  iohexol (OMNIPAQUE) 350 MG/ML injection 100 mL (100 mLs Intravenous Contrast Given 03/24/19 1733)     Initial Impression / Assessment and Plan / ED Course  I have reviewed the triage vital signs and the nursing notes.  Pertinent labs & imaging results that were available during my care of the patient were reviewed by me and considered in my medical decision making (see chart for details).  Clinical Course as of Mar 23 1738  Wed Mar 24, 2019  1602 On arrival to the ED, he is noted to be bradycardic and hypotensive.  He endorses dizziness, chest discomfort and nausea.  Symptoms started around 1:30 PM this afternoon and his getting ready to take a bath.  He initially felt some discomfort in his throat which then traveled down to his chest.  Upon EMS arrival he was noted to be hypertensive with systolic 180, bradycardic in the 40s.  He was given 2 sublingual nitroglycerin and 324 of aspirin by EMS.  3L NS. This  improved his symptoms. Exam with bradycardia, hypotension, 30mm BP difference systolic RUE 57 systolic LUE 57 systolic with pulse deficits L>R. Dr. Charm BargesButler joining me at beside   [JR]  1638 Pt in CT scan   [JR]  1702 Pt discussed with CT surgery Dr. Cliffton AstersLightfoot. He will be emerg[facility] to OR for surgical management. Recommends BP under 110 systolic. Appreciate consult.   [JR]  1707 Patient vomited after getting injection while he was on the table.  Scans look like he has an aortic dissection ascending and descending.  CT tech is trying to get a quick read on it and will reach out to CT surgery.   [MB]  1707 We identified this patient is under a incorrect medical record  and date of birth.  His charting was done on different record and we are attempting to reconstruct here now but the times will be off.   [MB]  [place] [facility] [place] [facility].  Patient updated on status.   [MB]  1709 Patient was seen on arrival hypotensive after getting nitro for throat and chest pain.  Patient states his pain is mostly gone although he still does not feel right in the chest.  He is remained bradycardic and hypotensive and did improve somewhat with 1/2 mg of atropine.  Discussed with Dr. Bronson Ing from cardiology who initially wanted to get a cardiac echo but were able to get the patient into CT first and he has an aortic dissection type a.  We reached out to cardiothoracic at [facility] and are attempting to transfer the patient down there as soon as possible.   [MB]  3614 The incorrect chart that the CAT scan and the other data were put into his medical record #[id number].   [MB]  28 Pt's daughter-in-law at bedside and updated. Pt  leaving now for [facility].   [JR]    Clinical Course User Index [JR] Joslin Doell, [place] N, PA-C [MB] Hayden Rasmussen, MD       Patient brought in by EMS with throat tightness radiating to his chest that began around 1:30 PM this afternoon.  Known  history of hypertension, however noncompliant with medications for at least 6 months.  He denies any other medical problems.  Patient was initially hypertensive on EMS arrival though bradycardic.  He was given nitroglycerin, aspirin, and 3 L of fluids as he became hypotensive in route.  On arrival he is significantly hypotensive at 54/37 and bradycardic in the 40s, alert and mentating properly.  He reports his chest pain is significantly improved though he feels like "something is wrong."  He has no known cardiac history. Pulse deficit present L>R as well as 11mmHg difference BP L>R. EKG with diffuse T wave changes.  Dr. Melina Copa consulted cardiology who recommended 0.5 mg of atropine which did provide some improvement in blood pressures.  Initially plan for cardiac echocardiogram, however patient was able to get CT dissection study of the chest promptly, after chest x-ray showing widened mediastinum with concern for dissection.  CT scan revealing aortic dissection beginning at the root and ending at the bifurcation of the left common iliac artery.  Results were discussed with CT surgeon, Dr. Kipp Brood.  He recommends discontinue IV fluid resuscitation with goal systolic blood pressure no higher than 110.  Patient will be transferred immediately to [facility] directly to the OR for further management.  Patient remains alert throughout ED stay.  His daughter-in-law arrived at bedside just prior to transport to [facility] and was updated.   Final Clinical Impressions(s) / ED Diagnoses   Final diagnoses:  Dissection of thoracoabdominal aorta [facility])    ED Discharge Orders    None       Emilyn Ruble, [place] N, PA-C 03/24/19 1739    Veverly Larimer, [place] N, PA-C 03/24/19 1740    Hayden Rasmussen, MD 03/25/19 [phone]

## 2019-03-24 NOTE — Anesthesia Procedure Notes (Signed)
Procedure Name: Intubation Date/Time: 03/24/2019 6:12 PM Performed by: Suzy Bouchard, CRNA Pre-anesthesia Checklist: Patient identified, Emergency Drugs available, Suction available, Patient being monitored and Timeout performed Patient Re-evaluated:Patient Re-evaluated prior to induction Oxygen Delivery Method: Circle system utilized Preoxygenation: Pre-oxygenation with 100% oxygen Induction Type: IV induction and Rapid sequence Laryngoscope Size: Mac and 4 Grade View: Grade I Tube size: 8.0 mm Number of attempts: 1 Airway Equipment and Method: Stylet and Oral airway Placement Confirmation: ETT inserted through vocal cords under direct vision,  positive ETCO2 and breath sounds checked- equal and bilateral Secured at: 23 cm Tube secured with: Tape Dental Injury: Teeth and Oropharynx as per pre-operative assessment  Comments: Intubation performed by Leim Fabry CRNA. Note copied forwards by this user.

## 2019-03-24 NOTE — Anesthesia Procedure Notes (Signed)
Arterial Line Insertion Start/End9/08/2018 6:15 PM Performed by: Inda Coke, CRNA, CRNA  Patient location: OR. Emergency situation Left, radial was placed Catheter size: 20 G Hand hygiene performed  and maximum sterile barriers used   Attempts: 1 Procedure performed without using ultrasound guided technique. Following insertion, dressing applied and Biopatch. Post procedure assessment: normal  Patient tolerated the procedure well with no immediate complications.

## 2019-03-24 NOTE — H&P (Addendum)
[address]       [place] [address]             [phone]        [place] Medical Record #[id number] Date of Birth: 03/10/1961  Referring: No ref. provider found Primary Care: Jeffrey Maize, MD (Inactive) Primary Cardiologist:No primary care provider on file.  Chief Complaint:    Chief Complaint  Patient presents with  . Dizziness    History of Present Illness:     58 yo male transferred from [facility] with an acute type A dissection.  He presented early today with chest pain radiating to his back, bradycardia and hypotension.  He received 1 dose of atropin, and 3.5L of fluid.  He was transferred emergently for surgical repair   Past Medical and Surgical History:   Past Medical History:  Diagnosis Date  . Dysphagia   . Dysuria   . Hematuria   . History of pneumothorax   . Mild acid reflux WATCHES DIET  . Nephrolithiasis LEFT    Past Surgical History:  Procedure Laterality Date  . ANAL FISSURE REPAIR  06-10-2006  . COLONOSCOPY, ESOPHAGOGASTRODUODENOSCOPY (EGD) AND ESOPHAGEAL DILATION N/A 09/20/2013   Procedure: COLONOSCOPY, ESOPHAGOGASTRODUODENOSCOPY (EGD) AND ESOPHAGEAL DILATION (ED);  Surgeon: Daneil Dolin, MD;  Location: [facility] ENDO SUITE;  Service: Endoscopy;  Laterality: N/A;  730  . CYSTO/ RETROGRADE PYELOGRAM/ LEFT URETEROSCOPY/ LEFT URETERAL STENT PLACEMENT  12-20-2011  . ESOPHAGOGASTRODUODENOSCOPY N/A 06/24/2013   AOZ:HYQMVHQION food impaction, s/p removal. esophagitis on path    Social History:   Social History   Tobacco Use  Smoking Status Never Smoker  Smokeless Tobacco Never Used    Social History   Substance and Sexual Activity  Alcohol Use Yes   Comment: 2-4 times monthly     No Known Allergies  Medications:   Current Facility-Administered Medications  Medication Dose Route Frequency Provider Last Rate Last Dose  . cefUROXime (ZINACEF) 1.5 g in sodium chloride 0.9 % 100 mL IVPB  1.5 g  Intravenous To OR Lyndee Leo, RPH      . cefUROXime (ZINACEF) 750 mg in sodium chloride 0.9 % 100 mL IVPB  750 mg Intravenous To OR Lyndee Leo, RPH      . dexmedetomidine (PRECEDEX) 400 MCG/100ML (4 mcg/mL) infusion  0.1-0.7 mcg/kg/hr Intravenous To OR Lyndee Leo, RPH      . DOPamine (INTROPIN) 800 mg in dextrose 5 % 250 mL (3.2 mg/mL) infusion  0-10 mcg/kg/min Intravenous To OR Lyndee Leo, RPH      . EPINEPHrine (ADRENALIN) 4 mg in NS 250 mL (0.016 mg/mL) premix infusion  0-10 mcg/min Intravenous To OR Lyndee Leo, RPH      . heparin 2,500 Units, papaverine 30 mg in electrolyte-148 (PLASMALYTE-148) 500 mL irrigation   Irrigation To OR Lyndee Leo, RPH      . heparin 30,000 units/NS 1000 mL solution for CELLSAVER   Other To OR Lyndee Leo, [facility]      . insulin regular, human (MYXREDLIN) 100 units/ 100 mL infusion   Intravenous To OR Lyndee Leo, RPH      . [place] Blood Cardioplegia vial (lidocaine/magnesium/mannitol 0.26g-4g-6.4g)   Intracoronary Once Lyndee Leo, RPH      . magnesium sulfate (IV Push/IM) injection 40 mEq  40 mEq Other To OR Lyndee Leo, [facility]      . milrinone (PRIMACOR) 20 MG/100 ML (0.2  mg/mL) infusion  0.3 mcg/kg/min Intravenous To OR Earnie Larsson, RPH      . nitroGLYCERIN 50 mg in dextrose 5 % 250 mL (0.2 mg/mL) infusion  2-200 mcg/min Intravenous To OR Earnie Larsson, RPH      . phenylephrine (NEOSYNEPHRINE) 20-0.9 MG/250ML-% infusion  30-200 mcg/min Intravenous To OR Earnie Larsson, RPH      . potassium chloride injection 80 mEq  80 mEq Other To OR Earnie Larsson, RPH      . tranexamic acid (CYKLOKAPRON) 2,500 mg in sodium chloride 0.9 % 250 mL (10 mg/mL) infusion  1.5 mg/kg/hr Intravenous To OR Earnie Larsson, RPH      . tranexamic acid (CYKLOKAPRON) bolus via infusion - over 30 minutes 1,095 mg  15 mg/kg Intravenous To OR Earnie Larsson, RPH      . tranexamic acid (CYKLOKAPRON) pump prime solution 146 mg  2 mg/kg  Intracatheter To OR Earnie Larsson, RPH      . vancomycin (VANCOCIN) 1,250 mg in sodium chloride 0.9 % 250 mL IVPB  1,250 mg Intravenous To OR Earnie Larsson, [facility]        Medications Prior to Admission  Medication Sig Dispense Refill Last Dose  . acetaminophen (TYLENOL) 500 MG tablet Take 1,000 mg by mouth every 6 (six) hours as needed for mild pain.     . DULoxetine (CYMBALTA) 20 MG capsule Take 2 capsules (40 mg total) by mouth daily. 60 capsule 11     Family History  Problem Relation Age of Onset  . Hypertension Father   . Arthritis Maternal Aunt   . Cancer Maternal Aunt   . Cancer Maternal Grandmother   . Arthritis Maternal Grandfather   . Cancer Maternal Grandfather   . Colon cancer Neg Hx      Review of Systems:   Review of Systems  Unable to perform ROS: Intubated      Physical Exam: BP (!) 81/49   Pulse (!) 53   Temp 98 F (36.7 C) (Oral)   Resp 16   Ht 6\' 1"  (1.854 m)   Wt 73 kg Comment: estimate  SpO2 100%   BMI 21.24 kg/m  Physical Exam  HENT:  Head: Normocephalic and atraumatic.  Poor dentition  Neck: Neck supple. No tracheal deviation present.  Cardiovascular: Normal rate.  Decreased left radial pulse.  Intact pulse in right upper and BLE  Pulmonary/Chest: Effort normal. No respiratory distress.  Abdominal: Soft. He exhibits no distension.  Skin: Skin is warm and dry.      Diagnostic Studies & Laboratory data:     I have independently reviewed the above radiologic studies and discussed with the patient   Recent Lab Findings: Lab Results  Component Value Date   WBC PT MISID IN REGISTRATION PER R. MINTER 9.2.20@1725  03/24/2019   HGB 13.3 03/24/2019   HCT 39.0 03/24/2019   PLT PT MISID IN REGISTRATION PER R. MINTER 9.2.20@1725  03/24/2019   GLUCOSE 106 (H) 03/24/2019   ALT 18 03/02/2018   AST 15 03/02/2018   NA 143 03/24/2019   K 3.3 (L) 03/24/2019   CL 106 03/24/2019   CREATININE 1.20 03/24/2019   BUN 12 03/24/2019   CO2  03/24/2019     PATIENT IDENTIFICATION ERROR. PLEASE DISREGARD RESULTS. ACCOUNT WILL BE CREDITED.   INR  03/24/2019    PATIENT IDENTIFICATION ERROR. PLEASE DISREGARD RESULTS. ACCOUNT WILL BE CREDITED.      Assessment / Plan:   58 yo male with  acute type A dissection OR for emergent repair       Jeffrey Flynn 03/24/2019 6:22 PM

## 2019-03-24 NOTE — OR Nursing (Signed)
First call to 2 Heart Charge nurse at [phone].

## 2019-03-24 NOTE — Anesthesia Procedure Notes (Signed)
Central Venous Catheter Insertion Performed by: Roberts Gaudy, MD, anesthesiologist Start/End9/08/2018 6:15 PM, 03/24/2019 6:25 PM Patient location: OR. Preanesthetic checklist: patient identified, IV checked, site marked, risks and benefits discussed, surgical consent, monitors and equipment checked, pre-op evaluation, timeout performed and anesthesia consent Hand hygiene performed  and maximum sterile barriers used  PA cath was placed.Swan type:thermodilution Procedure performed without using ultrasound guided technique. Attempts: 1 Following insertion, line sutured, dressing applied and Biopatch. Post procedure assessment: blood return through all ports, free fluid flow and no air  Patient tolerated the procedure well with no immediate complications.

## 2019-03-24 NOTE — Anesthesia Procedure Notes (Deleted)
Central Venous Catheter Insertion Performed by: Roberts Gaudy, MD, anesthesiologist Start/End9/08/2018 6:15 PM, 03/24/2019 6:25 PM Patient location: OR. Preanesthetic checklist: patient identified, IV checked, site marked, risks and benefits discussed, surgical consent, monitors and equipment checked, pre-op evaluation, timeout performed and anesthesia consent Lidocaine 1% used for infiltration and patient sedated Hand hygiene performed  and maximum sterile barriers used  Catheter size: 9 Fr MAC introducer Procedure performed using ultrasound guided technique. Ultrasound Notes:anatomy identified, needle tip was noted to be adjacent to the nerve/plexus identified, no ultrasound evidence of intravascular and/or intraneural injection and image(s) printed for medical record Attempts: 1 Following insertion, line sutured and dressing applied. Post procedure assessment: blood return through all ports, free fluid flow and no air  Patient tolerated the procedure well with no immediate complications.

## 2019-03-24 NOTE — Anesthesia Procedure Notes (Signed)
Central Venous Catheter Insertion Performed by: Roberts Gaudy, MD, anesthesiologist Start/End9/08/2018 6:15 PM, 03/24/2019 6:25 PM Patient location: OR. Preanesthetic checklist: patient identified, IV checked, site marked, risks and benefits discussed, surgical consent, monitors and equipment checked, pre-op evaluation, timeout performed and anesthesia consent Lidocaine 1% used for infiltration and patient sedated Hand hygiene performed  and maximum sterile barriers used  Catheter size: 9 Fr Sheath introducer Procedure performed using ultrasound guided technique. Ultrasound Notes:anatomy identified, needle tip was noted to be adjacent to the nerve/plexus identified, no ultrasound evidence of intravascular and/or intraneural injection and image(s) printed for medical record Attempts: 1 Following insertion, line sutured and dressing applied. Post procedure assessment: blood return through all ports, free fluid flow and no air  Patient tolerated the procedure well with no immediate complications.

## 2019-03-24 NOTE — Anesthesia Procedure Notes (Deleted)
Arterial Line Insertion Start/End9/08/2018 6:20 PM Performed by: Inda Coke, CRNA, CRNA  Emergency situation Right, radial was placed Catheter size: 20 G Hand hygiene performed  and maximum sterile barriers used  Allen's test indicative of satisfactory collateral circulation Attempts: 1 Procedure performed without using ultrasound guided technique. Following insertion, dressing applied and Biopatch. Post procedure assessment: normal  Patient tolerated the procedure well with no immediate complications.

## 2019-03-24 NOTE — Anesthesia Procedure Notes (Signed)
Arterial Line Insertion Start/End9/08/2018 6:20 PM Performed by: Inda Coke, CRNA, CRNA  Emergency situation Right, radial was placed Catheter size: 20 G Hand hygiene performed  and maximum sterile barriers used  Allen's test indicative of satisfactory collateral circulation Attempts: 1 Procedure performed without using ultrasound guided technique. Following insertion, Biopatch and dressing applied. Post procedure assessment: normal  Patient tolerated the procedure well with no immediate complications.

## 2019-03-24 NOTE — Anesthesia Preprocedure Evaluation (Addendum)
Anesthesia Evaluation    Airway        Dental   Pulmonary           Cardiovascular      Neuro/Psych    GI/Hepatic   Endo/Other    Renal/GU      Musculoskeletal   Abdominal (+) - obese,   Peds  Hematology   Anesthesia Other Findings   Reproductive/Obstetrics                            Anesthesia Physical Anesthesia Plan  ASA:   Anesthesia Plan:    Post-op Pain Management:    Induction:   PONV Risk Score and Plan:   Airway Management Planned:   Additional Equipment:   Intra-op Plan:   Post-operative Plan:   Informed Consent:   Plan Discussed with:   Anesthesia Plan Comments:        Anesthesia Quick Evaluation

## 2019-03-24 NOTE — ED Notes (Signed)
POC occult blood at 2247 was entered on incorrect pt

## 2019-03-24 NOTE — Anesthesia Procedure Notes (Addendum)
Arterial Line Insertion Start/End9/08/2018 6:15 PM Performed by: Inda Coke, CRNA, CRNA  Emergency situation Lidocaine 1% used for infiltration Left, radial was placed Catheter size: 20 G Hand hygiene performed  and maximum sterile barriers used   Attempts: 1 Procedure performed without using ultrasound guided technique. Following insertion, dressing applied and Biopatch. Post procedure assessment: normal  Patient tolerated the procedure well with no immediate complications.

## 2019-03-24 NOTE — ED Notes (Signed)
Pt give 0.5 mg Atropine

## 2019-03-24 NOTE — Anesthesia Procedure Notes (Deleted)
Procedure Name: Intubation Date/Time: 03/24/2019 6:12 PM Performed by: Elayne Snare, CRNA Pre-anesthesia Checklist: Patient identified, Emergency Drugs available, Suction available and Patient being monitored Patient Re-evaluated:Patient Re-evaluated prior to induction Oxygen Delivery Method: Circle System Utilized Preoxygenation: Pre-oxygenation with 100% oxygen Induction Type: IV induction, Rapid sequence and Cricoid Pressure applied Laryngoscope Size: Mac and 4 Grade View: Grade I Tube type: Oral Tube size: 8.0 mm Number of attempts: 1 Airway Equipment and Method: Stylet and Oral airway Placement Confirmation: ETT inserted through vocal cords under direct vision,  positive ETCO2 and breath sounds checked- equal and bilateral Secured at: 23 cm Tube secured with: Tape Dental Injury: Teeth and Oropharynx as per pre-operative assessment

## 2019-03-24 NOTE — ED Notes (Signed)
Pt BP in RT arm 57/30 (repeated multiple times), LT arm 87/43.

## 2019-03-24 NOTE — ED Notes (Addendum)
Pt BP in RT arm 57/30 (repeated multiple times), LT arm 87/43. EDP at bedside. Code cart at bedside and pads placed on patient.

## 2019-03-24 NOTE — ED Triage Notes (Signed)
Per EMS, pt from home c/o sudden onset of neck pain, sore throat, generalized weakness, dizziness, and indigestion. When EMS arrived, pt noted to have HR in 30s.  Pt given 2L NS, 324 ASA, & 2 Nitro en route. BP 180/61. Pt AOx4 at this time.

## 2019-03-24 NOTE — ED Notes (Signed)
EDP at bedside  

## 2019-03-24 NOTE — Brief Op Note (Signed)
03/24/2019  10:19 AM  PATIENT:  Jeffrey Flynn  58 y.o. male  PRE-OPERATIVE DIAGNOSIS:  aortic dissection  POST-OPERATIVE DIAGNOSIS:  aortic dissection  PROCEDURE:  Procedure(s): THORACIC ASCENDING ANEURYSM REPAIR (AAA) (N/A)  SURGEON:  Surgeon(s) and Role:    * Lightfoot, Lucile Crater, MD - Primary    * Grace Isaac, MD - Assisting   ANESTHESIA:   general  EBL:  935 mL   BLOOD ADMINISTERED:none  DRAINS: routine   LOCAL MEDICATIONS USED:  NONE  SPECIMEN:  Source of Specimen:  aortic dissection  DISPOSITION OF SPECIMEN:  PATHOLOGY  COUNTS:  YES   DICTATION: .Dragon Dictation  PLAN OF CARE: Admit to inpatient   PATIENT DISPOSITION:  ICU - intubated and hemodynamically stable.   Delay start of Pharmacological VTE agent (>24hrs) due to surgical blood loss or risk of bleeding: yes

## 2019-03-24 NOTE — ED Notes (Signed)
Pt transported to CT with RN

## 2019-03-25 ENCOUNTER — Inpatient Hospital Stay (HOSPITAL_COMMUNITY): Payer: Self-pay

## 2019-03-25 ENCOUNTER — Encounter (HOSPITAL_COMMUNITY): Payer: Self-pay

## 2019-03-25 LAB — TYPE AND SCREEN: Unit division: 0

## 2019-03-25 LAB — PREPARE FRESH FROZEN PLASMA
Unit division: 0
Unit division: 0
Unit division: 0
Unit division: 0
Unit division: 0

## 2019-03-25 LAB — BASIC METABOLIC PANEL
Anion gap: 9 (ref 5–15)
Anion gap: 11 (ref 5–15)
BUN: 17 mg/dL (ref 6–20)
BUN: 22 mg/dL — ABNORMAL HIGH (ref 6–20)
CO2: 24 mmol/L (ref 22–32)
CO2: 22 mmol/L (ref 22–32)
Calcium: 7 mg/dL — ABNORMAL LOW (ref 8.9–10.3)
Calcium: 7.3 mg/dL — ABNORMAL LOW (ref 8.9–10.3)
Chloride: 107 mmol/L (ref 98–111)
Chloride: 109 mmol/L (ref 98–111)
Creatinine, Ser: 1.63 mg/dL — ABNORMAL HIGH (ref 0.61–1.24)
Creatinine, Ser: 1.77 mg/dL — ABNORMAL HIGH (ref 0.61–1.24)
GFR calc Af Amer: 54 mL/min — ABNORMAL LOW (ref >60)
GFR calc Af Amer: 49 mL/min — ABNORMAL LOW (ref >60)
GFR calc non Af Amer: 47 mL/min — ABNORMAL LOW (ref >60)
GFR calc non Af Amer: 42 mL/min — ABNORMAL LOW (ref >60)
Glucose, Bld: 115 mg/dL — ABNORMAL HIGH (ref 70–99)
Glucose, Bld: 127 mg/dL — ABNORMAL HIGH (ref 70–99)
Potassium: 3.6 mmol/L (ref 3.5–5.1)
Potassium: 3.4 mmol/L — ABNORMAL LOW (ref 3.5–5.1)
Sodium: 140 mmol/L (ref 135–145)
Sodium: 142 mmol/L (ref 135–145)

## 2019-03-25 LAB — BPAM FFP
Blood Product Expiration Date: 202009062359
Blood Product Expiration Date: 202009062359
Blood Product Expiration Date: 202009062359
Blood Product Expiration Date: 202009252359
Blood Product Expiration Date: 202009062359
Blood Product Expiration Date: 202009072359
ISSUE DATE / TIME: 202009021831
ISSUE DATE / TIME: 202009021922
ISSUE DATE / TIME: 202009021922
ISSUE DATE / TIME: 202009022144
ISSUE DATE / TIME: 202009022144
ISSUE DATE / TIME: 202009022144
Unit Type and Rh: 600
Unit Type and Rh: 6200
Unit Type and Rh: 8400
Unit Type and Rh: 8400
Unit Type and Rh: 600
Unit Type and Rh: 6200

## 2019-03-25 LAB — CBC
HCT: 37.9 % — ABNORMAL LOW (ref 39.0–52.0)
HCT: 38.9 % — ABNORMAL LOW (ref 39.0–52.0)
HCT: 37 % — ABNORMAL LOW (ref 39.0–52.0)
Hemoglobin: 12.8 g/dL — ABNORMAL LOW (ref 13.0–17.0)
Hemoglobin: 12.9 g/dL — ABNORMAL LOW (ref 13.0–17.0)
Hemoglobin: 12.4 g/dL — ABNORMAL LOW (ref 13.0–17.0)
MCH: 29.5 pg (ref 26.0–34.0)
MCH: 29.3 pg (ref 26.0–34.0)
MCH: 29.6 pg (ref 26.0–34.0)
MCHC: 33.8 g/dL (ref 30.0–36.0)
MCHC: 33.2 g/dL (ref 30.0–36.0)
MCHC: 33.5 g/dL (ref 30.0–36.0)
MCV: 87.3 fL (ref 80.0–100.0)
MCV: 88.2 fL (ref 80.0–100.0)
MCV: 88.3 fL (ref 80.0–100.0)
Platelets: 124 10*3/uL — ABNORMAL LOW (ref 150–400)
Platelets: 146 10*3/uL — ABNORMAL LOW (ref 150–400)
Platelets: 132 10*3/uL — ABNORMAL LOW (ref 150–400)
RBC: 4.34 MIL/uL (ref 4.22–5.81)
RBC: 4.41 MIL/uL (ref 4.22–5.81)
RBC: 4.19 MIL/uL — ABNORMAL LOW (ref 4.22–5.81)
RDW: 13.7 % (ref 11.5–15.5)
RDW: 13.9 % (ref 11.5–15.5)
RDW: 14.3 % (ref 11.5–15.5)
WBC: 17.4 10*3/uL — ABNORMAL HIGH (ref 4.0–10.5)
WBC: 12.5 10*3/uL — ABNORMAL HIGH (ref 4.0–10.5)
WBC: 12.6 10*3/uL — ABNORMAL HIGH (ref 4.0–10.5)
nRBC: 0 % (ref 0.0–0.2)
nRBC: 0 % (ref 0.0–0.2)
nRBC: 0 % (ref 0.0–0.2)

## 2019-03-25 LAB — POCT I-STAT 7, (LYTES, BLD GAS, ICA,H+H)
Acid-base deficit: 4 mmol/L — ABNORMAL HIGH (ref 0.0–2.0)
Acid-base deficit: 1 mmol/L (ref 0.0–2.0)
Acid-base deficit: 2 mmol/L (ref 0.0–2.0)
Acid-base deficit: 1 mmol/L (ref 0.0–2.0)
Bicarbonate: 23 mmol/L (ref 20.0–28.0)
Bicarbonate: 24.3 mmol/L (ref 20.0–28.0)
Bicarbonate: 22.7 mmol/L (ref 20.0–28.0)
Bicarbonate: 24.9 mmol/L (ref 20.0–28.0)
Bicarbonate: 23.7 mmol/L (ref 20.0–28.0)
Calcium, Ion: 0.94 mmol/L — ABNORMAL LOW (ref 1.15–1.40)
Calcium, Ion: 1.04 mmol/L — ABNORMAL LOW (ref 1.15–1.40)
Calcium, Ion: 1.06 mmol/L — ABNORMAL LOW (ref 1.15–1.40)
Calcium, Ion: 1.1 mmol/L — ABNORMAL LOW (ref 1.15–1.40)
Calcium, Ion: 1.06 mmol/L — ABNORMAL LOW (ref 1.15–1.40)
HCT: 37 % — ABNORMAL LOW (ref 39.0–52.0)
HCT: 36 % — ABNORMAL LOW (ref 39.0–52.0)
HCT: 36 % — ABNORMAL LOW (ref 39.0–52.0)
HCT: 37 % — ABNORMAL LOW (ref 39.0–52.0)
HCT: 35 % — ABNORMAL LOW (ref 39.0–52.0)
Hemoglobin: 12.6 g/dL — ABNORMAL LOW (ref 13.0–17.0)
Hemoglobin: 12.2 g/dL — ABNORMAL LOW (ref 13.0–17.0)
Hemoglobin: 12.2 g/dL — ABNORMAL LOW (ref 13.0–17.0)
Hemoglobin: 12.6 g/dL — ABNORMAL LOW (ref 13.0–17.0)
Hemoglobin: 11.9 g/dL — ABNORMAL LOW (ref 13.0–17.0)
O2 Saturation: 93 %
O2 Saturation: 96 %
O2 Saturation: 99 %
O2 Saturation: 98 %
O2 Saturation: 95 %
Patient temperature: 36.9
Patient temperature: 36.4
Patient temperature: 97.2
Patient temperature: 97.1
Patient temperature: 97.1
Potassium: 4.4 mmol/L (ref 3.5–5.1)
Potassium: 3.7 mmol/L (ref 3.5–5.1)
Potassium: 3.9 mmol/L (ref 3.5–5.1)
Potassium: 3.9 mmol/L (ref 3.5–5.1)
Potassium: 3.6 mmol/L (ref 3.5–5.1)
Sodium: 142 mmol/L (ref 135–145)
Sodium: 143 mmol/L (ref 135–145)
Sodium: 144 mmol/L (ref 135–145)
Sodium: 144 mmol/L (ref 135–145)
Sodium: 145 mmol/L (ref 135–145)
TCO2: 24 mmol/L (ref 22–32)
TCO2: 26 mmol/L (ref 22–32)
TCO2: 24 mmol/L (ref 22–32)
TCO2: 26 mmol/L (ref 22–32)
TCO2: 25 mmol/L (ref 22–32)
pCO2 arterial: 48.8 mmHg — ABNORMAL HIGH (ref 32.0–48.0)
pCO2 arterial: 42.9 mmHg (ref 32.0–48.0)
pCO2 arterial: 34.9 mmHg (ref 32.0–48.0)
pCO2 arterial: 39.8 mmHg (ref 32.0–48.0)
pCO2 arterial: 36.7 mmHg (ref 32.0–48.0)
pH, Arterial: 7.282 — ABNORMAL LOW (ref 7.350–7.450)
pH, Arterial: 7.358 (ref 7.350–7.450)
pH, Arterial: 7.419 (ref 7.350–7.450)
pH, Arterial: 7.401 (ref 7.350–7.450)
pH, Arterial: 7.413 (ref 7.350–7.450)
pO2, Arterial: 77 mmHg — ABNORMAL LOW (ref 83.0–108.0)
pO2, Arterial: 83 mmHg (ref 83.0–108.0)
pO2, Arterial: 130 mmHg — ABNORMAL HIGH (ref 83.0–108.0)
pO2, Arterial: 106 mmHg (ref 83.0–108.0)
pO2, Arterial: 72 mmHg — ABNORMAL LOW (ref 83.0–108.0)

## 2019-03-25 LAB — BPAM PLATELET PHERESIS
Blood Product Expiration Date: 202009032359
Blood Product Expiration Date: 202009042359
ISSUE DATE / TIME: 202009022145
ISSUE DATE / TIME: 202009022145
Unit Type and Rh: 6200
Unit Type and Rh: 6200

## 2019-03-25 LAB — GLUCOSE, CAPILLARY
Glucose-Capillary: 133 mg/dL — ABNORMAL HIGH (ref 70–99)
Glucose-Capillary: 122 mg/dL — ABNORMAL HIGH (ref 70–99)
Glucose-Capillary: 105 mg/dL — ABNORMAL HIGH (ref 70–99)
Glucose-Capillary: 124 mg/dL — ABNORMAL HIGH (ref 70–99)
Glucose-Capillary: 131 mg/dL — ABNORMAL HIGH (ref 70–99)
Glucose-Capillary: 116 mg/dL — ABNORMAL HIGH (ref 70–99)
Glucose-Capillary: 104 mg/dL — ABNORMAL HIGH (ref 70–99)
Glucose-Capillary: 106 mg/dL — ABNORMAL HIGH (ref 70–99)
Glucose-Capillary: 107 mg/dL — ABNORMAL HIGH (ref 70–99)
Glucose-Capillary: 98 mg/dL (ref 70–99)
Glucose-Capillary: 113 mg/dL — ABNORMAL HIGH (ref 70–99)
Glucose-Capillary: 102 mg/dL — ABNORMAL HIGH (ref 70–99)
Glucose-Capillary: 57 mg/dL — ABNORMAL LOW (ref 70–99)
Glucose-Capillary: 122 mg/dL — ABNORMAL HIGH (ref 70–99)
Glucose-Capillary: 111 mg/dL — ABNORMAL HIGH (ref 70–99)
Glucose-Capillary: 128 mg/dL — ABNORMAL HIGH (ref 70–99)
Glucose-Capillary: 126 mg/dL — ABNORMAL HIGH (ref 70–99)
Glucose-Capillary: 113 mg/dL — ABNORMAL HIGH (ref 70–99)
Glucose-Capillary: 120 mg/dL — ABNORMAL HIGH (ref 70–99)
Glucose-Capillary: 105 mg/dL — ABNORMAL HIGH (ref 70–99)

## 2019-03-25 LAB — POCT I-STAT, CHEM 8
BUN: 18 mg/dL (ref 6–20)
BUN: 22 mg/dL — ABNORMAL HIGH (ref 6–20)
Calcium, Ion: 1.05 mmol/L — ABNORMAL LOW (ref 1.15–1.40)
Calcium, Ion: 1.11 mmol/L — ABNORMAL LOW (ref 1.15–1.40)
Chloride: 106 mmol/L (ref 98–111)
Chloride: 105 mmol/L (ref 98–111)
Creatinine, Ser: 1.6 mg/dL — ABNORMAL HIGH (ref 0.61–1.24)
Creatinine, Ser: 1.6 mg/dL — ABNORMAL HIGH (ref 0.61–1.24)
Glucose, Bld: 114 mg/dL — ABNORMAL HIGH (ref 70–99)
Glucose, Bld: 123 mg/dL — ABNORMAL HIGH (ref 70–99)
HCT: 37 % — ABNORMAL LOW (ref 39.0–52.0)
HCT: 35 % — ABNORMAL LOW (ref 39.0–52.0)
Hemoglobin: 12.6 g/dL — ABNORMAL LOW (ref 13.0–17.0)
Hemoglobin: 11.9 g/dL — ABNORMAL LOW (ref 13.0–17.0)
Potassium: 3.7 mmol/L (ref 3.5–5.1)
Potassium: 3.4 mmol/L — ABNORMAL LOW (ref 3.5–5.1)
Sodium: 143 mmol/L (ref 135–145)
Sodium: 143 mmol/L (ref 135–145)
TCO2: 23 mmol/L (ref 22–32)
TCO2: 22 mmol/L (ref 22–32)

## 2019-03-25 LAB — POCT I-STAT 4, (NA,K, GLUC, HGB,HCT)
Glucose, Bld: 122 mg/dL — ABNORMAL HIGH (ref 70–99)
Glucose, Bld: 139 mg/dL — ABNORMAL HIGH (ref 70–99)
HCT: 37 % — ABNORMAL LOW (ref 39.0–52.0)
HCT: 42 % (ref 39.0–52.0)
Hemoglobin: 12.6 g/dL — ABNORMAL LOW (ref 13.0–17.0)
Hemoglobin: 14.3 g/dL (ref 13.0–17.0)
Potassium: 4.1 mmol/L (ref 3.5–5.1)
Potassium: 4.6 mmol/L (ref 3.5–5.1)
Sodium: 140 mmol/L (ref 135–145)
Sodium: 143 mmol/L (ref 135–145)

## 2019-03-25 LAB — MAGNESIUM
Magnesium: 3.1 mg/dL — ABNORMAL HIGH (ref 1.7–2.4)
Magnesium: 2.6 mg/dL — ABNORMAL HIGH (ref 1.7–2.4)

## 2019-03-25 LAB — PREPARE PLATELET PHERESIS
Unit division: 0
Unit division: 0

## 2019-03-25 LAB — PROTIME-INR
INR: 1.5 — ABNORMAL HIGH (ref 0.8–1.2)
Prothrombin Time: 18.1 seconds — ABNORMAL HIGH (ref 11.4–15.2)

## 2019-03-25 LAB — BPAM CRYOPRECIPITATE
Blood Product Expiration Date: 202009030342
Blood Product Expiration Date: 202009030342
ISSUE DATE / TIME: 202009022212
ISSUE DATE / TIME: 202009022212
Unit Type and Rh: 6200
Unit Type and Rh: 6200

## 2019-03-25 LAB — ECHO INTRAOPERATIVE TEE
Height: 73 in
Weight: 4627.9 oz

## 2019-03-25 LAB — BLOOD PRODUCT ORDER (VERBAL) VERIFICATION

## 2019-03-25 LAB — PREPARE CRYOPRECIPITATE
Unit division: 0
Unit division: 0

## 2019-03-25 LAB — BPAM RBC
Blood Product Expiration Date: 202009182359
ISSUE DATE / TIME: 202009021826
Unit Type and Rh: 9500

## 2019-03-25 LAB — MRSA PCR SCREENING: MRSA by PCR: NEGATIVE

## 2019-03-25 LAB — POC OCCULT BLOOD, ED: Fecal Occult Bld: POSITIVE — AB

## 2019-03-25 LAB — APTT: aPTT: 38 seconds — ABNORMAL HIGH (ref 24–36)

## 2019-03-25 MED ORDER — DOCUSATE SODIUM 100 MG PO CAPS
200.0000 mg | ORAL_CAPSULE | Freq: Every day | ORAL | Status: DC
  Administered 2019-03-25 – 2019-04-02 (×8): 200 mg via ORAL
  Filled 2019-03-25 (×8): qty 2

## 2019-03-25 MED ORDER — METOPROLOL TARTRATE 5 MG/5ML IV SOLN
2.5000 mg | INTRAVENOUS | Status: DC | PRN
  Administered 2019-03-25: 06:00:00 2.5 mg via INTRAVENOUS

## 2019-03-25 MED ORDER — MIDAZOLAM HCL 2 MG/2ML IJ SOLN
INTRAMUSCULAR | Status: AC
  Filled 2019-03-25: qty 2

## 2019-03-25 MED ORDER — DEXMEDETOMIDINE HCL IN NACL 200 MCG/50ML IV SOLN
0.0000 ug/kg/h | INTRAVENOUS | Status: DC
  Administered 2019-03-25: 05:00:00 0.6 ug/kg/h via INTRAVENOUS
  Administered 2019-03-25 (×2): 0.5 ug/kg/h via INTRAVENOUS
  Filled 2019-03-25 (×2): qty 50

## 2019-03-25 MED ORDER — SODIUM CHLORIDE 0.9 % IV SOLN
INTRAVENOUS | Status: DC | PRN
  Administered 2019-03-24: 23:00:00 via INTRAVENOUS

## 2019-03-25 MED ORDER — SODIUM CHLORIDE 0.9 % IV SOLN: INTRAVENOUS | Status: DC

## 2019-03-25 MED ORDER — LIDOCAINE 2% (20 MG/ML) 5 ML SYRINGE
INTRAMUSCULAR | Status: AC
  Filled 2019-03-25: qty 5

## 2019-03-25 MED ORDER — FAMOTIDINE IN NACL 20-0.9 MG/50ML-% IV SOLN
20.0000 mg | Freq: Two times a day (BID) | INTRAVENOUS | Status: AC
  Administered 2019-03-25: 20 mg via INTRAVENOUS

## 2019-03-25 MED ORDER — INSULIN REGULAR(HUMAN) IN NACL 100-0.9 UT/100ML-% IV SOLN: INTRAVENOUS | Status: DC

## 2019-03-25 MED ORDER — MAGNESIUM SULFATE 4 GM/100ML IV SOLN
4.0000 g | Freq: Once | INTRAVENOUS | Status: AC
  Administered 2019-03-25: 4 g via INTRAVENOUS
  Filled 2019-03-25: qty 100

## 2019-03-25 MED ORDER — INSULIN REGULAR BOLUS VIA INFUSION
0.0000 [IU] | Freq: Three times a day (TID) | INTRAVENOUS | Status: DC
  Filled 2019-03-25: qty 10

## 2019-03-25 MED ORDER — POTASSIUM CHLORIDE 10 MEQ/50ML IV SOLN: 10.0000 meq | INTRAVENOUS | Status: AC

## 2019-03-25 MED ORDER — VANCOMYCIN HCL IN DEXTROSE 1-5 GM/200ML-% IV SOLN
1000.0000 mg | Freq: Once | INTRAVENOUS | Status: AC
  Administered 2019-03-25: 1000 mg via INTRAVENOUS
  Filled 2019-03-25: qty 200

## 2019-03-25 MED ORDER — LACTATED RINGERS IV SOLN: 500.0000 mL | Freq: Once | INTRAVENOUS | Status: DC | PRN

## 2019-03-25 MED ORDER — DOBUTAMINE IN D5W 4-5 MG/ML-% IV SOLN
2.5000 ug/kg/min | INTRAVENOUS | Status: DC
  Administered 2019-03-25: 5 ug/kg/min via INTRAVENOUS
  Filled 2019-03-25: qty 250

## 2019-03-25 MED ORDER — PROTAMINE SULFATE 10 MG/ML IV SOLN
INTRAVENOUS | Status: AC
  Filled 2019-03-25: qty 10

## 2019-03-25 MED ORDER — METOPROLOL TARTRATE 25 MG/10 ML ORAL SUSPENSION: 12.5000 mg | Freq: Two times a day (BID) | ORAL | Status: DC

## 2019-03-25 MED ORDER — BISACODYL 5 MG PO TBEC
10.0000 mg | DELAYED_RELEASE_TABLET | Freq: Every day | ORAL | Status: DC
  Administered 2019-03-25 – 2019-04-02 (×8): 10 mg via ORAL
  Filled 2019-03-25 (×9): qty 2

## 2019-03-25 MED ORDER — SODIUM CHLORIDE 0.9 % IV SOLN
1.5000 g | Freq: Two times a day (BID) | INTRAVENOUS | Status: AC
  Administered 2019-03-25 – 2019-03-26 (×4): 1.5 g via INTRAVENOUS
  Filled 2019-03-25 (×5): qty 1.5

## 2019-03-25 MED ORDER — CHLORHEXIDINE GLUCONATE 0.12 % MT SOLN
15.0000 mL | OROMUCOSAL | Status: AC
  Administered 2019-03-25: 15 mL via OROMUCOSAL

## 2019-03-25 MED ORDER — LACTATED RINGERS IV SOLN
INTRAVENOUS | Status: DC
  Administered 2019-03-25: 01:00:00 via INTRAVENOUS

## 2019-03-25 MED ORDER — LACTATED RINGERS IV SOLN: INTRAVENOUS | Status: DC

## 2019-03-25 MED ORDER — CHLORHEXIDINE GLUCONATE CLOTH 2 % EX PADS
6.0000 | MEDICATED_PAD | Freq: Every day | CUTANEOUS | Status: DC
  Administered 2019-03-25 – 2019-03-30 (×4): 6 via TOPICAL

## 2019-03-25 MED ORDER — MIDAZOLAM HCL 2 MG/2ML IJ SOLN
2.0000 mg | INTRAMUSCULAR | Status: DC | PRN
  Administered 2019-03-25: 02:00:00 2 mg via INTRAVENOUS
  Filled 2019-03-25: qty 2

## 2019-03-25 MED ORDER — SODIUM CHLORIDE 0.9% FLUSH: 3.0000 mL | INTRAVENOUS | Status: DC | PRN

## 2019-03-25 MED ORDER — ACETAMINOPHEN 500 MG PO TABS
1000.0000 mg | ORAL_TABLET | Freq: Four times a day (QID) | ORAL | Status: AC
  Administered 2019-03-25 – 2019-03-29 (×18): 1000 mg via ORAL
  Filled 2019-03-25 (×18): qty 2

## 2019-03-25 MED ORDER — ASPIRIN 81 MG PO CHEW
324.0000 mg | CHEWABLE_TABLET | Freq: Every day | ORAL | Status: DC
  Administered 2019-03-25: 324 mg
  Filled 2019-03-25: qty 4

## 2019-03-25 MED ORDER — ONDANSETRON HCL 4 MG/2ML IJ SOLN
4.0000 mg | Freq: Four times a day (QID) | INTRAMUSCULAR | Status: DC | PRN
  Administered 2019-03-25 (×2): 4 mg via INTRAVENOUS
  Filled 2019-03-25 (×2): qty 2

## 2019-03-25 MED ORDER — OXYCODONE HCL 5 MG PO TABS
5.0000 mg | ORAL_TABLET | ORAL | Status: DC | PRN
  Administered 2019-03-25 (×2): 5 mg via ORAL
  Administered 2019-03-28 – 2019-04-02 (×17): 10 mg via ORAL
  Filled 2019-03-25 (×5): qty 2
  Filled 2019-03-25: qty 1
  Filled 2019-03-25 (×3): qty 2
  Filled 2019-03-25: qty 1
  Filled 2019-03-25 (×9): qty 2

## 2019-03-25 MED ORDER — ALBUMIN HUMAN 5 % IV SOLN
250.0000 mL | INTRAVENOUS | Status: AC | PRN
  Administered 2019-03-25 (×3): 12.5 g via INTRAVENOUS
  Filled 2019-03-25: qty 250

## 2019-03-25 MED ORDER — ACETAMINOPHEN 160 MG/5ML PO SOLN: 650.0000 mg | Freq: Once | ORAL | Status: AC

## 2019-03-25 MED ORDER — NOREPINEPHRINE 4 MG/250ML-% IV SOLN: 0.0000 ug/min | INTRAVENOUS | Status: DC

## 2019-03-25 MED ORDER — METOPROLOL TARTRATE 12.5 MG HALF TABLET: 12.5000 mg | ORAL_TABLET | Freq: Two times a day (BID) | ORAL | Status: DC

## 2019-03-25 MED ORDER — MORPHINE SULFATE (PF) 2 MG/ML IV SOLN
1.0000 mg | INTRAVENOUS | Status: DC | PRN
  Administered 2019-03-25 (×2): 2 mg via INTRAVENOUS
  Filled 2019-03-25 (×2): qty 1

## 2019-03-25 MED ORDER — ASPIRIN EC 325 MG PO TBEC
325.0000 mg | DELAYED_RELEASE_TABLET | Freq: Every day | ORAL | Status: DC
  Administered 2019-03-26 – 2019-04-02 (×8): 325 mg via ORAL
  Filled 2019-03-25 (×8): qty 1

## 2019-03-25 MED ORDER — PROTAMINE SULFATE 10 MG/ML IV SOLN
INTRAVENOUS | Status: AC
  Filled 2019-03-25: qty 25

## 2019-03-25 MED ORDER — NICARDIPINE HCL IN NACL 20-0.86 MG/200ML-% IV SOLN
3.0000 mg/h | INTRAVENOUS | Status: DC
  Administered 2019-03-25: 3 mg/h via INTRAVENOUS
  Filled 2019-03-25: qty 200

## 2019-03-25 MED ORDER — HEPARIN SODIUM (PORCINE) 1000 UNIT/ML IJ SOLN
INTRAMUSCULAR | Status: AC
  Filled 2019-03-25: qty 1

## 2019-03-25 MED ORDER — ACETAMINOPHEN 650 MG RE SUPP
650.0000 mg | Freq: Once | RECTAL | Status: AC
  Administered 2019-03-25: 650 mg via RECTAL

## 2019-03-25 MED ORDER — CHLORHEXIDINE GLUCONATE 0.12% ORAL RINSE (MEDLINE KIT)
15.0000 mL | Freq: Two times a day (BID) | OROMUCOSAL | Status: DC
  Administered 2019-03-25: 15 mL via OROMUCOSAL

## 2019-03-25 MED ORDER — ROCURONIUM BROMIDE 10 MG/ML (PF) SYRINGE
PREFILLED_SYRINGE | INTRAVENOUS | Status: AC
  Filled 2019-03-25: qty 20

## 2019-03-25 MED ORDER — PANTOPRAZOLE SODIUM 40 MG PO TBEC
40.0000 mg | DELAYED_RELEASE_TABLET | Freq: Every day | ORAL | Status: DC
  Administered 2019-03-26 – 2019-04-02 (×8): 40 mg via ORAL
  Filled 2019-03-25 (×8): qty 1

## 2019-03-25 MED ORDER — SODIUM CHLORIDE 0.45 % IV SOLN
INTRAVENOUS | Status: DC | PRN
  Administered 2019-03-25: 01:00:00 via INTRAVENOUS

## 2019-03-25 MED ORDER — SUCCINYLCHOLINE CHLORIDE 200 MG/10ML IV SOSY
PREFILLED_SYRINGE | INTRAVENOUS | Status: AC
  Filled 2019-03-25: qty 10

## 2019-03-25 MED ORDER — TRAMADOL HCL 50 MG PO TABS
50.0000 mg | ORAL_TABLET | ORAL | Status: DC | PRN
  Administered 2019-03-25: 100 mg via ORAL
  Filled 2019-03-25: qty 2

## 2019-03-25 MED ORDER — ORAL CARE MOUTH RINSE
15.0000 mL | OROMUCOSAL | Status: DC
  Administered 2019-03-25 (×3): 15 mL via OROMUCOSAL

## 2019-03-25 MED ORDER — POTASSIUM CHLORIDE CRYS ER 20 MEQ PO TBCR
20.0000 meq | EXTENDED_RELEASE_TABLET | ORAL | Status: AC
  Administered 2019-03-25 – 2019-03-26 (×3): 20 meq via ORAL
  Filled 2019-03-25 (×3): qty 1

## 2019-03-25 MED ORDER — BISACODYL 10 MG RE SUPP: 10.0000 mg | Freq: Every day | RECTAL | Status: DC

## 2019-03-25 MED ORDER — POTASSIUM CHLORIDE 10 MEQ/50ML IV SOLN
10.0000 meq | INTRAVENOUS | Status: AC
  Administered 2019-03-25 (×3): 10 meq via INTRAVENOUS
  Filled 2019-03-25: qty 50

## 2019-03-25 MED ORDER — ACETAMINOPHEN 160 MG/5ML PO SOLN
1000.0000 mg | Freq: Four times a day (QID) | ORAL | Status: AC
  Administered 2019-03-25: 1000 mg
  Filled 2019-03-25: qty 40.6

## 2019-03-25 MED ORDER — SODIUM CHLORIDE 0.9% FLUSH
3.0000 mL | Freq: Two times a day (BID) | INTRAVENOUS | Status: DC
  Administered 2019-03-25 – 2019-04-02 (×13): 3 mL via INTRAVENOUS

## 2019-03-25 MED ORDER — SODIUM CHLORIDE 0.9 % IV SOLN
250.0000 mL | INTRAVENOUS | Status: DC
  Administered 2019-03-26: 13:00:00 250 mL via INTRAVENOUS

## 2019-03-25 NOTE — ED Notes (Signed)
All entries done on this chart are done to try to remove chart from chart rack.

## 2019-03-25 NOTE — Op Note (Signed)
[address]       [place] [address]             [phone]                                          03/25/2019 Patient:  Jeffrey Flynn Pre-Op Dx: Acute Type A dissection   Post-op Dx:  Same Procedure: Right artery cannulation with an 26mm Hemashield graft Anterograde cerebral perfusion with circulatory arrest Ascending aortic repair with hemi-arch Aortic valve resuspension.   Right femoral arterial line placement.  Surgeon and Role:      * Alease Fait, Jeffrey Crater, MD - Primary    * Dr. Servando Snare, MD - assisting   Anesthesia  general EBL:  500 ml Blood Administration: 2 Cryo, 2 plts, 4 FFP Xclamp Time:  69 min Anterograde Cerebral Perfusion Time: 43 min Pump Time:  170 min  Drains: 19 F blake drain X 2: mediastinal  Wires: none Counts: correct   Indications: 58 yo male transferred from [facility] with an acute type A dissection.  He presented early today with chest pain radiating to his back, bradycardia and hypotension.  He received 1 dose of atropin, and 3.5L of fluid.  He was transferred emergently for surgical repair  Findings: Type A dissection with tear above the sinotubular junction and flap extending retrograde and anterograde.  The valve and coronary ostia were normal.  Operative Technique: All invasive lines were placed in pre-op holding.  After the risks, benefits and alternatives were thoroughly discussed, the patient was brought to the operative theatre.  Anesthesia was induced, and the patient was prepped and draped in normal sterile fashion.  An appropriate surgical pause was performed, and pre-operative antibiotics were dosed accordingly.  We began with a 4 cm incision in the right deltopectoral groove and carried the soft combination of Bovie cautery and blunt dissection until we identified the right axillary artery.  5000 units of heparin was given and the artery was isolated.  An arteriotomy was created and an 8 mm Hemashield  graft was sewn on to the vessel.  Her arterial cannula was then advanced through the Hemashield graft.  The patient was then systemically heparinized and we made our incision over the chest and divided the sternum with a reciprocating saw.  The pericardium was divided in the midline and fashion to a cradle with pericardial stitches.  The right atrial appendage was used for our venous cannula site.  Retrograde cannula as well as an LV vent was placed through the right superior pulmonary vein.  Cardiopulmonary bypass was initiated and patient was systemically cooled to 28 degrees.  The innominate artery was then isolated and encircled with a rim L retractor.  Once we had cooled for 53minutes the innominate artery was then clamped and we initiated antegrade cerebral perfusion with circulatory arrest.  The ascending aorta was then divided.  There was a dissection flap above the sinotubular junction extending antegrade and retrograde.  The aorta was dissected off of the pulmonary artery and trimmed all the way up to the innominate artery.  Felt strips were used in between the dissection flap.  The a sending aorta was then sized to a 28 mm Hemashield graft.  This was sewn to the distal portion of the aorta in a running fashion.  After we obtained hemostasis the innominate artery  clamp was removed and cardiopulmonary bypass was reinitiated.  We then began to rewarm the patient.  We next focused our attention to the proximal portion of the aorta.  The dissection flap was trimmed down just to below above the sinotubular junction.  The aortic valve was resuspended with 4-0 Prolene in a pledgeted fashion.  We then created our proximal anastomosis with the graft with 4-0 Prolene in a running continuous  A root cannula was then placed through the graft and we began to de-air the heart.  The aortic cross-clamp was removed after reanimation dose of cardioplegia was given.  We separated from cardiopulmonary bypass without event and  decannulated the heart.  The heparin was reversed with protamine and meticulous hemostasis was obtained.  Chest tubes and wires were placed, and the sternum was re-approximated with with sternal wires.  The soft tissue and skin were re-approximated wth absorbable suture.    The patient tolerated the procedure without any immediate complications, and was transferred to the ICU in guarded condition.  Da Michelle Keane Scrape Laterrica Libman

## 2019-03-25 NOTE — Progress Notes (Signed)
      [address]       [place] [address]             [phone]      POD # 1 repair aortic dissection  Extubated earlier today. Sleeping presently  BP 94/60   Pulse 61   Temp 97.8 F (36.6 C) (Oral)   Resp 16   Ht 6\' 1"  (1.854 m)   Wt 131.2 kg   SpO2 93%   BMI 38.16 kg/m   Intake/Output Summary (Last 24 hours) at 03/25/2019 1702 Last data filed at 03/25/2019 1600 Gross per 24 hour  Intake 8573.07 ml  Output 2825 ml  Net 5748.07 ml   CBG well controlled HCt= 35  Doing well POD # 1  Corrie Brannen C. Roxan Hockey, MD [facility] [phone]

## 2019-03-25 NOTE — Progress Notes (Signed)
MD called regarding low CI despite volume replacement. UOP/BP adequate, HR 60's SR. Verbal order obtained to start on 5 mg dobutamine.

## 2019-03-25 NOTE — Procedures (Signed)
Extubation Procedure Note  Patient Details:   Name: Eulas Schweitzer DOB: 29-Dec-1960 MRN: [id number]   Airway Documentation:    Vent end date: (not recorded) Vent end time: (not recorded)   Evaluation  O2 sats: stable throughout Complications: No apparent complications Patient did tolerate procedure well. Bilateral Breath Sounds: Clear   Yes   Patient extubated to [place]. Vital signs stable at this time. No complications. RT will continue to monitor.  Marina Gravel [facility] 03/25/2019, 11:02 AM

## 2019-03-25 NOTE — Progress Notes (Signed)
      [address]       ,[place] [address]             [phone]                 1 Day Post-Op Procedure(s) (LRB): THORACIC ASCENDING ANEURYSM REPAIR (AAA) (N/A)   Events: No events _______________________________________________________________ Vitals: BP 105/62   Pulse 69   Temp (!) 97.5 F (36.4 C)   Resp (!) 30   Ht 6\' 1"  (1.854 m)   Wt 131.2 kg   SpO2 95%   BMI 38.16 kg/m   - Neuro: arousable, follows command  - Cardiovascular: Sins  Drips: none.   PAP: (25-60)/(10-30) 39/10 CVP:  [11 mmHg-22 mmHg] 12 mmHg CO:  [2.7 L/min-5.3 L/min] 4.1 L/min CI:  [1.1 L/min/m2-2.2 L/min/m2] 1.7 L/min/m2  - Pulm:  Vent Mode: CPAP;PSV FiO2 (%):  [40 %-100 %] 40 % Set Rate:  [12 bmp] 12 bmp Vt Set:  [640 mL] 640 mL PEEP:  [5 cmH20] 5 cmH20 Pressure Support:  [10 cmH20] 10 cmH20  ABG    Component Value Date/Time   PHART 7.401 03/25/2019 1055   PCO2ART 39.8 03/25/2019 1055   PO2ART 106.0 03/25/2019 1055   HCO3 24.9 03/25/2019 1055   TCO2 26 03/25/2019 1055   ACIDBASEDEF 2.0 03/25/2019 0909   O2SAT 98.0 03/25/2019 1055    - Abd: s/ND - Extremity: warm  .Intake/Output      09/02 0701 - 09/03 0700 09/03 0701 - 09/04 0700   I.V. (mL/kg) 3485.2 (26.6) 115.6 (0.9)   Blood 3351    NG/GT  20   IV Piggyback 1299.8 94.3   Total Intake(mL/kg) 8136 (62) 229.8 (1.8)   Urine (mL/kg/hr) 890 295 (0.5)   Emesis/NG output  100   Blood 935    Chest Tube 180 50   Total Output 2005 445   Net +6131 -215.2           _______________________________________________________________ Labs: CBC Latest Ref Rng & Units 03/25/2019 03/25/2019 03/25/2019  WBC 4.0 - 10.5 K/uL - - -  Hemoglobin 13.0 - 17.0 g/dL 12.6(L) 12.2(L) 12.2(L)  Hematocrit 39.0 - 52.0 % 37.0(L) 36.0(L) 36.0(L)  Platelets 150 - 400 K/uL - - -   CMP Latest Ref Rng & Units 03/25/2019 03/25/2019 03/25/2019  Glucose 70 - 99 mg/dL - - -  BUN 6 - 20 mg/dL - - -  Creatinine 0.61 - 1.24 mg/dL - - -  Sodium  135 - 145 mmol/L 144 144 143  Potassium 3.5 - 5.1 mmol/L 3.9 3.9 3.7  Chloride 98 - 111 mmol/L - - -  CO2 22 - 32 mmol/L - - -  Calcium 8.9 - 10.3 mg/dL - - -      _______________________________________________________________  Assessment and Plan: POD 0  s/p Type A dissection repair  Neuro: wean sedation as tolerated CV: stable.  BP control Pulm: wean to extubation Renal: good uop. Creat stable GI: NPO for now.  Will advance once extubated Heme: stable ID: [email or web address] Endo: BG stable Dispo: continue ICU care  Melodie Bouillon, MD 03/25/2019 11:11 AM

## 2019-03-25 NOTE — Anesthesia Postprocedure Evaluation (Signed)
Anesthesia Post Note  Patient: Jeffrey Flynn  Procedure(s) Performed: THORACIC ASCENDING ANEURYSM REPAIR (AAA) (N/A )     Patient location during evaluation: SICU Anesthesia Type: General Level of consciousness: sedated and patient remains intubated per anesthesia plan Pain management: pain level controlled Vital Signs Assessment: post-procedure vital signs reviewed and stable Respiratory status: patient remains intubated per anesthesia plan and patient on ventilator - see flowsheet for VS Cardiovascular status: stable Postop Assessment: no apparent nausea or vomiting Anesthetic complications: no    Last Vitals:  Vitals:   03/24/19 1705 03/24/19 1710  BP: 105/63 (!) 98/58  Pulse: (!) 58 (!) 58  Resp: 18 (!) 22  Temp:    SpO2: 100% 100%    Last Pain:  Vitals:   03/24/19 1550  TempSrc: Oral                 Mckoy Bhakta COKER

## 2019-03-25 NOTE — Progress Notes (Signed)
MD called regarding patient's SBP increasing to 220's. Dobutamine turned off per MD request. Will continue to monitor.

## 2019-03-25 NOTE — Transfer of Care (Signed)
Immediate Anesthesia Transfer of Care Note  Patient: Jeffrey Flynn  Procedure(s) Performed: THORACIC ASCENDING ANEURYSM REPAIR (AAA) (N/A )  Patient Location: SICU  Anesthesia Type:General  Level of Consciousness: sedated and Patient remains intubated per anesthesia plan  Airway & Oxygen Therapy: Patient remains intubated per anesthesia plan and Patient placed on Ventilator (see vital sign flow sheet for setting)  Post-op Assessment: Report given to RN and Post -op Vital signs reviewed and stable  Post vital signs: Reviewed and stable  Last Vitals:  Vitals Value Taken Time  BP    Temp    Pulse    Resp    SpO2      Last Pain:  Vitals:   03/24/19 1550  TempSrc: Oral         Complications: No apparent anesthesia complications

## 2019-03-26 LAB — CBC
HCT: 37.2 % — ABNORMAL LOW (ref 39.0–52.0)
HCT: 37.8 % — ABNORMAL LOW (ref 39.0–52.0)
Hemoglobin: 12.3 g/dL — ABNORMAL LOW (ref 13.0–17.0)
Hemoglobin: 12.3 g/dL — ABNORMAL LOW (ref 13.0–17.0)
MCH: 29.6 pg (ref 26.0–34.0)
MCH: 29.5 pg (ref 26.0–34.0)
MCHC: 33.1 g/dL (ref 30.0–36.0)
MCHC: 32.5 g/dL (ref 30.0–36.0)
MCV: 89.4 fL (ref 80.0–100.0)
MCV: 90.6 fL (ref 80.0–100.0)
Platelets: 130 10*3/uL — ABNORMAL LOW (ref 150–400)
Platelets: 135 10*3/uL — ABNORMAL LOW (ref 150–400)
RBC: 4.16 MIL/uL — ABNORMAL LOW (ref 4.22–5.81)
RBC: 4.17 MIL/uL — ABNORMAL LOW (ref 4.22–5.81)
RDW: 14.6 % (ref 11.5–15.5)
RDW: 14.6 % (ref 11.5–15.5)
WBC: 17.9 10*3/uL — ABNORMAL HIGH (ref 4.0–10.5)
WBC: 18 10*3/uL — ABNORMAL HIGH (ref 4.0–10.5)
nRBC: 0 % (ref 0.0–0.2)
nRBC: 0 % (ref 0.0–0.2)

## 2019-03-26 LAB — BASIC METABOLIC PANEL
Anion gap: 9 (ref 5–15)
BUN: 28 mg/dL — ABNORMAL HIGH (ref 6–20)
CO2: 23 mmol/L (ref 22–32)
Calcium: 7.7 mg/dL — ABNORMAL LOW (ref 8.9–10.3)
Chloride: 108 mmol/L (ref 98–111)
Creatinine, Ser: 1.56 mg/dL — ABNORMAL HIGH (ref 0.61–1.24)
GFR calc Af Amer: 57 mL/min — ABNORMAL LOW (ref >60)
GFR calc non Af Amer: 49 mL/min — ABNORMAL LOW (ref >60)
Glucose, Bld: 119 mg/dL — ABNORMAL HIGH (ref 70–99)
Potassium: 3.6 mmol/L (ref 3.5–5.1)
Sodium: 140 mmol/L (ref 135–145)

## 2019-03-26 LAB — GLUCOSE, CAPILLARY
Glucose-Capillary: 102 mg/dL — ABNORMAL HIGH (ref 70–99)
Glucose-Capillary: 103 mg/dL — ABNORMAL HIGH (ref 70–99)
Glucose-Capillary: 104 mg/dL — ABNORMAL HIGH (ref 70–99)
Glucose-Capillary: 106 mg/dL — ABNORMAL HIGH (ref 70–99)
Glucose-Capillary: 106 mg/dL — ABNORMAL HIGH (ref 70–99)
Glucose-Capillary: 122 mg/dL — ABNORMAL HIGH (ref 70–99)
Glucose-Capillary: 138 mg/dL — ABNORMAL HIGH (ref 70–99)
Glucose-Capillary: 90 mg/dL (ref 70–99)
Glucose-Capillary: 90 mg/dL (ref 70–99)
Glucose-Capillary: 93 mg/dL (ref 70–99)
Glucose-Capillary: 95 mg/dL (ref 70–99)

## 2019-03-26 MED ORDER — SODIUM CHLORIDE 0.9% FLUSH: 3.0000 mL | INTRAVENOUS | Status: DC | PRN

## 2019-03-26 MED ORDER — MOVING RIGHT ALONG BOOK
Freq: Once | Status: AC
  Administered 2019-03-26: 10:00:00
  Filled 2019-03-26: qty 1

## 2019-03-26 MED ORDER — INSULIN ASPART 100 UNIT/ML ~~LOC~~ SOLN: 0.0000 [IU] | Freq: Three times a day (TID) | SUBCUTANEOUS | Status: DC

## 2019-03-26 MED ORDER — INSULIN ASPART 100 UNIT/ML ~~LOC~~ SOLN: 0.0000 [IU] | SUBCUTANEOUS | Status: DC

## 2019-03-26 MED ORDER — FUROSEMIDE 40 MG PO TABS
40.0000 mg | ORAL_TABLET | Freq: Every day | ORAL | Status: DC
  Administered 2019-03-26 – 2019-04-02 (×8): 40 mg via ORAL
  Filled 2019-03-26 (×8): qty 1

## 2019-03-26 MED ORDER — ATORVASTATIN CALCIUM 40 MG PO TABS
40.0000 mg | ORAL_TABLET | Freq: Every day | ORAL | Status: DC
  Administered 2019-03-26 – 2019-04-01 (×7): 40 mg via ORAL
  Filled 2019-03-26 (×8): qty 1

## 2019-03-26 MED ORDER — SODIUM CHLORIDE 0.9% FLUSH
3.0000 mL | Freq: Two times a day (BID) | INTRAVENOUS | Status: DC
  Administered 2019-03-26 – 2019-03-28 (×5): 3 mL via INTRAVENOUS

## 2019-03-26 MED ORDER — ENOXAPARIN SODIUM 30 MG/0.3ML ~~LOC~~ SOLN
30.0000 mg | Freq: Every day | SUBCUTANEOUS | Status: DC
  Administered 2019-03-26 – 2019-04-01 (×7): 30 mg via SUBCUTANEOUS
  Filled 2019-03-26 (×7): qty 0.3

## 2019-03-26 MED ORDER — POTASSIUM CHLORIDE CRYS ER 20 MEQ PO TBCR
40.0000 meq | EXTENDED_RELEASE_TABLET | Freq: Once | ORAL | Status: AC
  Administered 2019-03-26: 10:00:00 40 meq via ORAL
  Filled 2019-03-26: qty 2

## 2019-03-26 MED ORDER — METOPROLOL TARTRATE 12.5 MG HALF TABLET
12.5000 mg | ORAL_TABLET | Freq: Two times a day (BID) | ORAL | Status: DC
  Administered 2019-03-26 (×2): 12.5 mg via ORAL
  Filled 2019-03-26 (×2): qty 1

## 2019-03-26 MED ORDER — SODIUM CHLORIDE 0.9 % IV SOLN: 250.0000 mL | INTRAVENOUS | Status: DC | PRN

## 2019-03-26 NOTE — H&P (Signed)
[address]       [place] [id number]             [phone]                                       Rudene ReRandy J Hurwitz [facility] Medical Record #[id number] Date of Birth: 03/10/1961  Referring: No ref. provider found Primary Care: Johna SheriffVincent, Carol L, MD (Inactive) Primary Cardiologist:No primary care provider on file.  Chief Complaint:       Chief Complaint  Patient presents with  . Dizziness    History of Present Illness:     58 yo male transferred [facility] with an acute type A dissection.  He presented early today with chest pain radiating to his back, bradycardia and hypotension.  He received 1 dose of atropin, and 3.5L of fluid.  He was transferred emergently for surgical repair   Past Medical and Surgical History:       Past Medical History:  Diagnosis Date  . Dysphagia   . Dysuria   . Hematuria   . History of pneumothorax   . Mild acid reflux WATCHES DIET  . Nephrolithiasis LEFT         Past Surgical History:  Procedure Laterality Date  . ANAL FISSURE REPAIR  06-10-2006  . COLONOSCOPY, ESOPHAGOGASTRODUODENOSCOPY (EGD) AND ESOPHAGEAL DILATION N/A 09/20/2013   Procedure: COLONOSCOPY, ESOPHAGOGASTRODUODENOSCOPY (EGD) AND ESOPHAGEAL DILATION (ED);  Surgeon: Corbin Adeobert M Rourk, MD;  Location: [facility] ENDO SUITE;  Service: Endoscopy;  Laterality: N/A;  730  . CYSTO/ RETROGRADE PYELOGRAM/ LEFT URETEROSCOPY/ LEFT URETERAL STENT PLACEMENT  12-20-2011  . ESOPHAGOGASTRODUODENOSCOPY N/A 06/24/2013   OZH:YQMVHQIONGRMR:Esophageal food impaction, s/p removal. esophagitis on path    Social History:   Social History   Tobacco Use  Smoking Status Never Smoker  Smokeless Tobacco Never Used    Social History       Substance and Sexual Activity  Alcohol Use Yes   Comment: 2-4 times monthly     No Known Allergies  Medications:            Current Facility-Administered Medications  Medication Dose Route Frequency Provider Last  Rate Last Dose  . cefUROXime (ZINACEF) 1.5 g in sodium chloride 0.9 % 100 mL IVPB  1.5 g Intravenous To OR Earnie LarssonWilson, Frank R, RPH      . cefUROXime (ZINACEF) 750 mg in sodium chloride 0.9 % 100 mL IVPB  750 mg Intravenous To OR Earnie LarssonWilson, Frank R, RPH      . dexmedetomidine (PRECEDEX) 400 MCG/100ML (4 mcg/mL) infusion  0.1-0.7 mcg/kg/hr Intravenous To OR Earnie LarssonWilson, Frank R, RPH      . DOPamine (INTROPIN) 800 mg in dextrose 5 % 250 mL (3.2 mg/mL) infusion  0-10 mcg/kg/min Intravenous To OR Earnie LarssonWilson, Frank R, RPH      . EPINEPHrine (ADRENALIN) 4 mg in NS 250 mL (0.016 mg/mL) premix infusion  0-10 mcg/min Intravenous To OR Earnie LarssonWilson, Frank R, RPH      . heparin 2,500 Units, papaverine 30 mg in electrolyte-148 (PLASMALYTE-148) 500 mL irrigation   Irrigation To OR Earnie LarssonWilson, Frank R, RPH      . heparin 30,000 units/NS 1000 mL solution for CELLSAVER   Other To OR Earnie LarssonWilson, Frank R, [facility]      . insulin regular, human (MYXREDLIN) 100 units/ 100 mL infusion   Intravenous To OR Earnie LarssonWilson, Frank R,  RPH      . [place] Blood Cardioplegia vial (lidocaine/magnesium/mannitol 0.26g-4g-6.4g)   Intracoronary Once Lyndee Leo, RPH      . magnesium sulfate (IV Push/IM) injection 40 mEq  40 mEq Other To OR Lyndee Leo, [facility]      . milrinone (PRIMACOR) 20 MG/100 ML (0.2 mg/mL) infusion  0.3 mcg/kg/min Intravenous To OR Lyndee Leo, RPH      . nitroGLYCERIN 50 mg in dextrose 5 % 250 mL (0.2 mg/mL) infusion  2-200 mcg/min Intravenous To OR Lyndee Leo, RPH      . phenylephrine (NEOSYNEPHRINE) 20-0.9 MG/250ML-% infusion  30-200 mcg/min Intravenous To OR Lyndee Leo, RPH      . potassium chloride injection 80 mEq  80 mEq Other To OR Lyndee Leo, RPH      . tranexamic acid (CYKLOKAPRON) 2,500 mg in sodium chloride 0.9 % 250 mL (10 mg/mL) infusion  1.5 mg/kg/hr Intravenous To OR Lyndee Leo, RPH      . tranexamic acid (CYKLOKAPRON) bolus via infusion - over 30 minutes 1,095 mg  15 mg/kg Intravenous To OR Lyndee Leo,  RPH      . tranexamic acid (CYKLOKAPRON) pump prime solution 146 mg  2 mg/kg Intracatheter To OR Lyndee Leo, RPH      . vancomycin (VANCOCIN) 1,250 mg in sodium chloride 0.9 % 250 mL IVPB  1,250 mg Intravenous To OR Lyndee Leo, [facility]               Medications Prior to Admission  Medication Sig Dispense Refill Last Dose  . acetaminophen (TYLENOL) 500 MG tablet Take 1,000 mg by mouth every 6 (six) hours as needed for mild pain.     . DULoxetine (CYMBALTA) 20 MG capsule Take 2 capsules (40 mg total) by mouth daily. 60 capsule 11          Family History  Problem Relation Age of Onset  . Hypertension Father   . Arthritis Maternal Aunt   . Cancer Maternal Aunt   . Cancer Maternal Grandmother   . Arthritis Maternal Grandfather   . Cancer Maternal Grandfather   . Colon cancer Neg Hx      Review of Systems:   Review of Systems  Unable to perform ROS: Intubated                 Physical Exam: BP (!) 81/49   Pulse (!) 53   Temp 98 F (36.7 C) (Oral)   Resp 16   Ht 6\' 1"  (1.854 m)   Wt 73 kg Comment: estimate  SpO2 100%   BMI 21.24 kg/m  Physical Exam  HENT:  Head: Normocephalic and atraumatic.  Poor dentition  Neck: Neck supple. No tracheal deviation present.  Cardiovascular: Normal rate.  Decreased left radial pulse.  Intact pulse in right upper and BLE  Pulmonary/Chest: Effort normal. No respiratory distress.  Abdominal: Soft. He exhibits no distension.  Skin: Skin is warm and dry.      Diagnostic Studies & Laboratory data:     I have independently reviewed the above radiologic studies and discussed with the patient   Recent Lab Findings: Recent Labs        Lab Results  Component Value Date   WBC PT MISID IN REGISTRATION PER R. MINTER 9.2.20@1725  03/24/2019   HGB 13.3 03/24/2019   HCT 39.0 03/24/2019   PLT PT MISID IN REGISTRATION PER R. MINTER 9.2.20@1725  03/24/2019   GLUCOSE 106 (H)  03/24/2019   ALT 18 03/02/2018    AST 15 03/02/2018   NA 143 03/24/2019   K 3.3 (L) 03/24/2019   CL 106 03/24/2019   CREATININE 1.20 03/24/2019   BUN 12 03/24/2019   CO2  03/24/2019    PATIENT IDENTIFICATION ERROR. PLEASE DISREGARD RESULTS. ACCOUNT WILL BE CREDITED.   INR  03/24/2019    PATIENT IDENTIFICATION ERROR. PLEASE DISREGARD RESULTS. ACCOUNT WILL BE CREDITED.        Assessment / Plan:   58 yo male with acute type A dissection OR for emergent repair       Corliss Skains 03/24/2019 6:22 PM              Electronically signed by Corliss Skains, MD at 03/24/2019 6:27 PM   ED on 03/24/2019     Routing History     Detailed Report

## 2019-03-26 NOTE — Progress Notes (Signed)
      [address]       [place] [address]             [phone]                 2 Days Post-Op Procedure(s) (LRB): THORACIC ASCENDING ANEURYSM REPAIR (AAA) (N/A)   Events: No events.  Doing well _______________________________________________________________ Vitals: BP 116/61   Pulse 73   Temp 97.9 F (36.6 C) (Oral)   Resp 18   Ht 6\' 1"  (1.854 m)   Wt 130.8 kg   SpO2 96%   BMI 38.04 kg/m   - Neuro: arousable, follows command  - Cardiovascular: Sins  Drips: none.      - Pulm: EWOB ABG    Component Value Date/Time   PHART 7.413 03/25/2019 1201   PCO2ART 36.7 03/25/2019 1201   PO2ART 72.0 (L) 03/25/2019 1201   HCO3 23.7 03/25/2019 1201   TCO2 22 03/25/2019 1711   ACIDBASEDEF 1.0 03/25/2019 1201   O2SAT 95.0 03/25/2019 1201    - Abd: s/ND - Extremity: warm  .Intake/Output      09/03 0701 - 09/04 0700 09/04 0701 - 09/05 0700   I.V. (mL/kg) 544.7 (4.2)    Blood     NG/GT 20    IV Piggyback 294.3    Total Intake(mL/kg) 859 (6.6)    Urine (mL/kg/hr) 1485 (0.5)    Emesis/NG output 100    Blood     Chest Tube 280    Total Output 1865    Net -1006            _______________________________________________________________ Labs: CBC Latest Ref Rng & Units 03/26/2019 03/25/2019 03/25/2019  WBC 4.0 - 10.5 K/uL 17.9(H) - 12.6(H)  Hemoglobin 13.0 - 17.0 g/dL 12.3(L) 11.9(L) 12.4(L)  Hematocrit 39.0 - 52.0 % 37.2(L) 35.0(L) 37.0(L)  Platelets 150 - 400 K/uL 130(L) - 132(L)   CMP Latest Ref Rng & Units 03/25/2019 03/25/2019 03/25/2019  Glucose 70 - 99 mg/dL 123(H) 127(H) -  BUN 6 - 20 mg/dL 22(H) 22(H) -  Creatinine 0.61 - 1.24 mg/dL 1.60(H) 1.77(H) -  Sodium 135 - 145 mmol/L 143 142 145  Potassium 3.5 - 5.1 mmol/L 3.4(L) 3.4(L) 3.6  Chloride 98 - 111 mmol/L 105 109 -  CO2 22 - 32 mmol/L - 22 -  Calcium 8.9 - 10.3 mg/dL - 7.3(L) -      _______________________________________________________________  Assessment and Plan: POD 1.5 s/p Type  A dissection repair  Neuro: pain controlled CV: stable.  BP control Pulm: pulm toilet, ambulation Renal: good uop. Will start diuresis today GI: advance Heme: stable ID: [email or web address] Endo: SSI Dispo: floor  Melodie Bouillon, MD 03/26/2019 8:39 AM

## 2019-03-26 NOTE — Discharge Summary (Signed)
Physician Discharge Summary        [address] [place]       Jeffrey Flynn [address]             [phone]      Patient ID: Jeffrey Flynn MRN: [id number] DOB/AGE: 01-29-1961 58 y.o.  Admit date: 03/24/2019 Discharge date: 04/02/2019  Admission Diagnoses:  Patient Active Problem List   Diagnosis Date Noted   Aortic dissection (HCC) 03/25/2019    Discharge Diagnoses:  Active Problems:   Aortic dissection [facility])   Discharged Condition: good  HPI:  Jeffrey Flynn is a 58 yo male with an unknown medical history who was transferred from [facility] with an acute type A dissection. He presented early on 9/3 with chest pain radiating to his back, bradycardia and hypotension. He was given nitro x 2 in route to [facility]. He received 1 dose of atropin, and 3.5L of fluid. He was transferred emergently for surgical repair.    Hospital Course:   On 03/25/2027 Jeffrey Flynn underwent a emergent repair of a type aortic dissection with Dr. Cliffton Asters and Dr. Lowella Fairy assisting.  He tolerated the procedure well and was transferred to the surgical ICU for continued care.  He was extubated timely manner.  Postop day 1 he was stable.  We continue to wean sedation as tolerated.  He had good urine output and stable kidney function.  Postop day 2 his pain remained well controlled.  He was extubated and alert.  We initiated diuretics for fluid overload.  We advance his diet as tolerated.  He remained afebrile.  He was stable to transfer to the telemetry floor for continued care.  He has maintained NSR.  His blood pressure became uncontrolled.  He was started on an ACE inhibitor for better BP control which was titrated accordingly. Lopressor was increased to 75 mg bid for better HR and BP control.  He admitted to being on anti-hypertensive prior to admission, which he had quit taking.  I stressed the importance of medical compliance with medications.  His JP drain output decreased and these  were removed on 03/29/2019.  His creatinine was initially elevated post operatively but this did resolve as creatinine was down to 0.82 on 09/11.   He was still hypertensive and tachycardic so as of 09/09, he is on Lopressor 100 mg bid and Lisinopril has been titrated to 40 mg daily, and he is also on Amlodipine 5 mg daily. Of note, 09/08, patient did not feel well;he had complaints of pain across his chest (has had since surgery), headache. I discussed with Dr. Cliffton Asters. Patient was not hypgolycemic, was hypertensive so medications were adjusted accordingly, chest x ray silhoutte was stable, and patient has been on Lovenox for DVT prophylaxis. Per Dr. Cliffton Asters, he did not have evidence of pericardial effusion or tamponade. He had continued to have nausea and dizziness upon standing but symptoms have been improving daily. These sypmtoms are felt secondary to his improved control of BP, which prior to admission was not controlled at all. He is ambulating without difficulty.  His incisions are healing without evidence of infection. He is medically stable for discharge home today.  Consults: None  Significant Diagnostic Studies:  CLINICAL DATA:  Status post repair of ascending thoracic aortic aneurysm.  EXAM: PORTABLE CHEST 1 VIEW  COMPARISON:  03/25/2019  FINDINGS: The endotracheal tube tip is above the carina. There is a NG tube with tip below the GE junction. Swan-Ganz catheter tip is at the  right ventricular outflow tract. Mediastinal drains are in place. Cardiac enlargement and pulmonary vascular congestion. No pneumothorax identified. Small bilateral pleural effusions unchanged.  IMPRESSION: 1. Stable position of support apparatus. 2. Small effusions and pulmonary vascular congestion.   Electronically Signed   By: Taylor  Stroud M.D.   On: 03/25/2019 08:32  Treatments:   03/25/2019 Patient:  Jeffrey Flynn Pre-Op Dx: Acute Type A dissection   Post-op Dx:  Same Procedure: Right  artery cannulation with an 28[facility] [phone]-Shirlee LaThurmond ButtscBayside Center For Behav104ioPegChrisShawnee Mission Prairie Star SuDwaSherene SireSummit Surgery CenterLLCt Anterograde 68mBoise Va Medical [phone] LaThurmond ButtscChildrens Hospital O67f PegChrisPerry County DwaSherene SireTri 25mNew York Presbyterian [phone]-Shirlee LaThurmond ButtscLapeer County Su51rgPegChrisRiverside HospiDwaSherene SireSt Joseph'S Medical Centerana with circulatory arrest Ascending aortic repair wit68mSurgery Center Of [phone]-Shirlee LaThurmond ButtscBenefis Health Care (66EaPegChrisUtaDwaSherene SireNaval Health Clinic New England64mPark Nicollet [phone]-Shirlee LaThurmond ButtscShriners Hospitals For Chil29drPegChrisAzar Eye SuDwaSherene SireSouthwest Healthcare 55mKeck Hospital [phone]-Shirlee LaThurmond ButtscHarris Health System Quentin Me56asPegChrisVillages Regional Hospital SuDwaSherene SireMemorialcare Surgical Center At Saddleback LLC Dba Laguna Niguel41mEphraim Mcdowell Fort Logan [phone] LaThurmond ButtscSturgis Regio56naPegChrisWestDwaSherene SireTug Valley Arh Regional Medical Centertal resuspension.   Right femoral arterial line placement.  Sur43mAllegiance Behavioral Health Center Of [phone]-Shirlee LaThurmond ButtscEndoscopy Cent34erPegChrisSturdy MDwaSherene SireCommunity Hospital Monterey Peninsulatal  * Lightfoot, Harrell O, MD - Primary    * Dr. 15mAllegheny General [phone]-Shirlee LaThurmond ButtscSouthcoast Hospitals Group - Charlton Memor10iaPegChrisTwin Rivers DwaSherene SireThedacare Medical Cen65mEast Freedom Surgical [phone]-Shirlee LaThurmond ButtscPrisma Health Greer Memor47iaPegChrisCullman RegionaDwaSherene SireChevy Chase Endoscopy Centerteristing   Anesth2mSeattle Children'S [phone]-Shirlee LaThurmond ButtscCascade Val52lePegChrisTennova Healthcare Turkey CreeDwaSherene SireGottleb Memorial Hospital Loyola Health Sys41mYuma District [phone]-Shirlee LaThurmond ButtscElmhurst Hos3piPegChrisAllegiance Behavioral Health CenDwaSherene SireWomen'S And Chil30mGreenville Community [phone]-Shirlee LaThurmond ButtscNew York Presbyt71erPegChrisUniversity Hospitals ConneauDwaSherene SireTreasure 70mOchsner Medical Center- [phone]-Shirlee LaThurmond ButtscChristus Dubuis Hospital66 OPegChrisDiamDwaSherene SireLos Angeles Surgical Center A Medi6mCrook County Medical Services [phone] LaThurmond ButtscIntegris Canadian Val61lePegChrisWyoming SurDwaSherene SireTuscaloosa Va89mWinnebago Mental Hlth [phone] LaThurmond ButtscThe Surgery Center At Northbay38 VPegChrisEastwDwaSherene SireCec Surgic82mBellin Orthopedic Surgery [phone] LaThurmond ButtscDaniels Memor34iaPegChrisFour County CDwaSherene SireMount Sinai Rehabili69mDelray Medical [phone] LaThurmond ButtscBullock Cou40ntPegChrisTexas Health Presbyterian HospiDwaSherene SireArkansas State Hospitalund  500 ml Blood Admi45mNorthern Virginia Mental Health [phone]-Shirlee LaThurmond ButtscOutpatient Eye Su56rgPegChrisVa Hudson Valley HDwaSherene SireTria Orthopaedic Center 72mShriners Hospital For [phone] LaThurmond ButtscCrescent City Surger43y PegChrisNorthwest Medical Center - Willow Creek DwaSherene SireSouthern Regional4mMadonna Rehabilitation Specialty Hospital [phone]-Shirlee LaThurmond ButtscNorthern Arizona Healthcare Orthopedic Surger37y PegChrisTilden CoDwaSherene SireNew Orleans La Uptown Jeffrey Bank Endoscopy Asc24mWellspan Good Samaritan [phone]-Shirlee LaThurmond ButtscCarepoint Health-Hoboken University Me31diPegChrisZachary DwaSherene SireAlta Bates Summit Med Ctr-Summit Campus-HawthorneLLCTime:  69 min Anterograde Cerebral Perfusion Time: 43 min Pump Time:  170 min  Drains: 19 F blake drain X 2: mediastinal  Wires: none Counts: correct   Indications: 57 yo male transferred from [facility] with an acute type A dissection. He presented early today with chest pain radiating to his back, bradycardia and hypotension. He received 1 dose of atropin, and 3.5L of fluid. He was transferred emergently for surgical repair  Findings: Type A dissection with tear above the sinotubular junction and flap extending retrograde and anterograde.  The valve and coronary ostia were normal.  Operative Technique: All invasive lines were placed in pre-op holding.  After the risks, benefits and alternatives were thoroughly discussed, the patient was brought to the operative theatre.  Anesthesia was induced, and the patient was prepped and draped in normal sterile fashion.  An appropriate surgical pause was performed, and pre-operative antibiotics were dosed accordingly.  We began with a 4 cm incision in the right deltopectoral groove and carried the soft combination of Bovie cautery and blunt dissection until we identified the right axillary artery.  5000 units of heparin was given and the artery was isolated.  An arteriotomy was created and an 8 mm Hemashield graft was sewn on to the vessel.  Her arterial cannula was then advanced through the Hemashield graft.  The patient was then systemically heparinized and we made our incision over the chest and divided the sternum with a reciprocating  saw.  The pericardium was divided in the midline and fashion to a cradle with pericardial stitches.  The right atrial appendage was used for our venous cannula site.  Retrograde cannula as well as an LV vent was placed through the right superior pulmonary vein.  Cardiopulmonary bypass was initiated and patient was systemically cooled to 28 degrees.  The innominate artery was then isolateBelton ReMarland KitchHMarland Jeffrey KitcheDevMercy RehabilitatiMarland KitcheDevWest SuMarlaWilMarland KitcMedstar SurgeMarlandBay Area ReMarland Ambulatory Surgical FacilMarland KitcheEncompass Health RehabilitatiMarland NMarland KitcMarland KitcheDeSiMarland KitcheDeMMarland KitcheDArkansas Continued Care Jeffrey KitcheDevra DoJMarland KitcAdventist Healthcare BehavioMarland KitcSt. Joseph'Jeffrey KitcheDevra DoMarland Kitchenp7190ElsiKentuckye ChesleMelene [email or web address] [email or web address] [email or web address] irculatory arrest.  The ascending aorta was then divided.  There was a dissection flap above the sinotubular junction extending antegrade and retrograde.  The aorta was dissected off of the pulmonary artery and trimmed all the way up to the innominate artery.  Felt strips were used in between the dissection flap.  The a sending aorta was then sized to a 28 mm Hemashield graft.  This was sewn to the distal portion of the aorta in a running fashion.  After we obtained hemostasis the innominate artery clamp was removed and cardiopulmonary bypass was reinitiated.  We then began to rewarm the  patient.  We next focused our attention to the proximal portion of the aorta.  The dissection flap was trimmed down just to below above the sinotubular junction.  The aortic valve was resuspended with 4-0 Prolene in a pledgeted fashion.  We then created our proximal anastomosis with the graft with 4-0 Prolene in a running continuous  A root cannula was then placed through the graft and we began to de-air the heart.  The aortic cross-clamp was removed after reanimation dose of cardioplegia was given.  We separated from cardiopulmonary bypass without event and decannulated the heart.  The heparin was reversed with protamine and meticulous hemostasis was obtained.  Chest tubes and wires were placed, and the sternum was re-approximated with with sternal wires.  The soft tissue and skin  were re-approximated wth absorbable suture.    The patient tolerated the procedure without any immediate complications, and was transferred to the ICU in guarded condition.  Lajuana Matte  Discharge Exam: Blood pressure (!) 149/78, pulse 79, temperature 98.1 F (36.7 C), temperature source Oral, resp. rate 16, height 6\' 1"  (1.854 m), weight 116.8 kg, SpO2 96 %.  Cardiovascular: RRR, no murmur Pulmonary: Clear to auscultation bilaterally Abdomen: Soft, non tender, bowel sounds present. Extremities: Trace bilateral lower extremity edema. Wounds: Clean and dry.  No erythema or signs of infection.   Disposition: Discharge disposition: 01-Home or Self Care        Allergies as of 04/02/2019   No Known Allergies     Medication List    TAKE these medications   acetaminophen 325 MG tablet Commonly known as: TYLENOL Take 2 tablets (650 mg total) by mouth every 6 (six) hours as needed for mild pain, fever or headache.   amLODipine 5 MG tablet Commonly known as: NORVASC Take 1 tablet (5 mg total) by mouth daily.   aspirin 325 MG EC tablet Take 1 tablet (325 mg total) by mouth daily.   atorvastatin 40 MG tablet Commonly known as: LIPITOR Take 1 tablet (40 mg total) by mouth daily at 6 PM.   furosemide 40 MG tablet Commonly known as: LASIX Take 1 tablet (40 mg total) by mouth daily. For 4 days then stop.   lisinopril 40 MG tablet Commonly known as: ZESTRIL Take 1 tablet (40 mg total) by mouth daily.   metoprolol tartrate 100 MG tablet Commonly known as: LOPRESSOR Take 1 tablet (100 mg total) by mouth 2 (two) times daily.   oxyCODONE 5 MG immediate release tablet Commonly known as: Oxy IR/ROXICODONE Take 1 tablet (5 mg total) by mouth every 4 (four) hours as needed for severe pain.   potassium chloride SA 20 MEQ tablet Commonly known as: K-DUR Take 1 tablet (20 mEq total) by mouth daily. For 4 days then stop.      Follow-up Information    Leonie Man,  MD Follow up on 04/05/2019.   Specialty: Cardiology Why: at 8:00AM for cardiology evaluation.  Contact information: [address] [place] [address] [phone]        Lajuana Matte, MD. Go on 04/09/2019.   Specialty: Thoracic Surgery Why: Appointment time is 1:00 pm at 12:15 pm.  Contact information: [place] [address] [phone]          The patient has been discharged on:   1.Beta Blocker:  Yes [  yes ]  No   [   ]                              If No, reason:  2.Ace Inhibitor/ARB: Yes [ yes  ]                                     No  [    ]                                     If No, reason:   3.Statin:   Yes [ yes  ]                  No  [   ]                  If No, reason:  4.Ecasa:  Yes  [ yes  ]                  No   [   ]                  If No, reason:    Signed: Elenore RotaDonielle M Rosana Farnell PA-C 04/02/2019, 8:07 AM

## 2019-03-26 NOTE — Progress Notes (Signed)
Anesthesiology Follow-up:  Awake and alert, sitting in chair, neuro intact, hemodynamically stable.  VS: T- 36.6 BP- 125/61 HR- 73 (SR) RR- 18 O2 Sat 96% on 4L [place]  K-3.4 BUN/Cr.- 22/1.60  Glucose- 123 H/H- 12.3/37.2 Platelets- 130,000  Extubated at 11:00 am yesterday (12hours post-op)  58 year old male one day S/P repair of ascending aortic dissection. Stable post-op course, no apparent complications.  Roberts Gaudy

## 2019-03-26 NOTE — Progress Notes (Signed)
MD verbally told this RN that he wants to keep chest tubes today 03/26/2019 to bulb suction. RN will continue to monitor.

## 2019-03-26 NOTE — Progress Notes (Signed)
Report called to Sonia Baller, RN on 4E.

## 2019-03-26 NOTE — Discharge Instructions (Signed)

## 2019-03-26 NOTE — Progress Notes (Addendum)
Pt arrived from [facility] in wheelchair. Pt sweating, c/o pain to chest. 7/10. Pt placed in bed, chg bath  Given. Cold rag applied. Vitasl obtained and stable. Telebox 23 applied/ccmd notifed. All belongings with pt. CBG @102 . 02 via cann applied at 2 lpm. Pt states he is feeling better of laying down. EKG obtained and on chart Call bell within reach/ pt oriented to room. Will continue to monitor.  Jerald Kief

## 2019-03-27 ENCOUNTER — Encounter (HOSPITAL_COMMUNITY): Payer: Self-pay

## 2019-03-27 ENCOUNTER — Inpatient Hospital Stay (HOSPITAL_COMMUNITY): Payer: Self-pay

## 2019-03-27 LAB — GLUCOSE, CAPILLARY
Glucose-Capillary: 112 mg/dL — ABNORMAL HIGH (ref 70–99)
Glucose-Capillary: 100 mg/dL — ABNORMAL HIGH (ref 70–99)
Glucose-Capillary: 93 mg/dL (ref 70–99)
Glucose-Capillary: 102 mg/dL — ABNORMAL HIGH (ref 70–99)

## 2019-03-27 LAB — CBC
HCT: 36.6 % — ABNORMAL LOW (ref 39.0–52.0)
Hemoglobin: 11.8 g/dL — ABNORMAL LOW (ref 13.0–17.0)
MCH: 29.6 pg (ref 26.0–34.0)
MCHC: 32.2 g/dL (ref 30.0–36.0)
MCV: 91.7 fL (ref 80.0–100.0)
Platelets: 136 10*3/uL — ABNORMAL LOW (ref 150–400)
RBC: 3.99 MIL/uL — ABNORMAL LOW (ref 4.22–5.81)
RDW: 14.5 % (ref 11.5–15.5)
WBC: 15.3 10*3/uL — ABNORMAL HIGH (ref 4.0–10.5)
nRBC: 0 % (ref 0.0–0.2)

## 2019-03-27 LAB — BASIC METABOLIC PANEL
Anion gap: 8 (ref 5–15)
BUN: 29 mg/dL — ABNORMAL HIGH (ref 6–20)
CO2: 25 mmol/L (ref 22–32)
Calcium: 7.8 mg/dL — ABNORMAL LOW (ref 8.9–10.3)
Chloride: 107 mmol/L (ref 98–111)
Creatinine, Ser: 1.29 mg/dL — ABNORMAL HIGH (ref 0.61–1.24)
GFR calc Af Amer: >60 mL/min (ref >60)
GFR calc non Af Amer: >60 mL/min (ref >60)
Glucose, Bld: 97 mg/dL (ref 70–99)
Potassium: 3.9 mmol/L (ref 3.5–5.1)
Sodium: 140 mmol/L (ref 135–145)

## 2019-03-27 MED ORDER — METOCLOPRAMIDE HCL 5 MG/ML IJ SOLN
10.0000 mg | Freq: Four times a day (QID) | INTRAMUSCULAR | Status: AC
  Administered 2019-03-27 (×4): 10 mg via INTRAVENOUS
  Filled 2019-03-27 (×4): qty 2

## 2019-03-27 MED ORDER — METOPROLOL TARTRATE 25 MG PO TABS
25.0000 mg | ORAL_TABLET | Freq: Two times a day (BID) | ORAL | Status: DC
  Administered 2019-03-27 – 2019-03-28 (×4): 25 mg via ORAL
  Filled 2019-03-27 (×4): qty 1

## 2019-03-27 MED ORDER — LISINOPRIL 5 MG PO TABS
5.0000 mg | ORAL_TABLET | Freq: Every day | ORAL | Status: DC
  Administered 2019-03-27: 09:00:00 5 mg via ORAL
  Filled 2019-03-27 (×2): qty 1

## 2019-03-27 NOTE — Progress Notes (Signed)
CARDIAC REHAB PHASE I   PRE:  Rate/Rhythm: 74 SR  BP:  Supine: 132/84  Sitting:   Standing:    SaO2: 99% 2L   95%RA  MODE:  Ambulation: 200 ft   POST:  Rate/Rhythm: 93 SR  BP:  Supine:   Sitting: 146/88  Standing:    SaO2: 92%RA 1110-1210 Pt sleepy but willing to walk. Pt walked 200 ft on RA with rolling walker and asst x1 with gait belt use. Pt stopped a couple of times to rest. Some DOE noted but sats good on RA. Pt stated he has walker available at home if needed. Pt had to use bathroom so cut walk short. Some urinary incontinence with difficulty getting close enough to toilet. Changed footies and helped pt get cleaned up. Changed gown. Back to bed with bed alarm and call light. Encouraged IS. Left off oxygen. Left heart healthy diet, encouraged IS, reviewed sternal precautions and left walking instructions. Pt getting sleepy. Will review next visit.   Graylon Good, RN BSN  03/27/2019 12:01 PM

## 2019-03-27 NOTE — Progress Notes (Addendum)
      [address]       [place] [address]             [phone]      3 Days Post-Op Procedure(s) (LRB): THORACIC ASCENDING ANEURYSM REPAIR (AAA) (N/A)   Subjective:  Patient up in chair.  Doing okay, wants to go back to bed as his "tail" hurts.  + ambulation No BM  Objective: Vital signs in last 24 hours: Temp:  [97.6 F (36.4 C)-98.7 F (37.1 C)] 98.7 F (37.1 C) (09/05 0738) Pulse Rate:  [71-79] 78 (09/05 0738) Cardiac Rhythm: Normal sinus rhythm (09/05 0400) Resp:  [14-20] 16 (09/04 2011) BP: (108-159)/(56-92) 159/92 (09/05 0738) SpO2:  [92 %-99 %] 93 % (09/05 0738) Arterial Line BP: (130-137)/(68-70) 137/70 (09/04 1000) FiO2 (%):  [28 %] 28 % (09/04 0853) Weight:  [130.4 kg] 130.4 kg (09/05 0624)  Intake/Output from previous day: 09/04 0701 - 09/05 0700 In: 932.6 [P.O.:720; I.V.:112.6; IV Piggyback:100] Out: 1425 [Urine:1125; Chest Tube:300] Intake/Output this shift: Total I/O In: -  Out: 265 [Urine:200; Chest Tube:65]  General appearance: alert, cooperative and no distress Heart: regular rate and rhythm Lungs: clear to auscultation bilaterally Abdomen: soft, non-tender; bowel sounds normal; no masses,  no organomegaly Extremities: edema trace Wound: clean and dry  Lab Results: Recent Labs    03/26/19 1001 03/27/19 0315  WBC 18.0* 15.3*  HGB 12.3* 11.8*  HCT 37.8* 36.6*  PLT 135* 136*   BMET:  Recent Labs    03/26/19 1001 03/27/19 0315  NA 140 140  K 3.6 3.9  CL 108 107  CO2 23 25  GLUCOSE 119* 97  BUN 28* 29*  CREATININE 1.56* 1.29*  CALCIUM 7.7* 7.8*    PT/INR:  Recent Labs    03/25/19 0047  LABPROT 18.1*  INR 1.5*   ABG    Component Value Date/Time   PHART 7.413 03/25/2019 1201   HCO3 23.7 03/25/2019 1201   TCO2 22 03/25/2019 1711   ACIDBASEDEF 1.0 03/25/2019 1201   O2SAT 95.0 03/25/2019 1201   CBG (last 3)  Recent Labs    03/26/19 1636 03/26/19 2135 03/27/19 0644  GLUCAP 93 90 112*     Assessment/Plan: S/P Procedure(s) (LRB): THORACIC ASCENDING ANEURYSM REPAIR (AAA) (N/A)  1. CV- NSR with PVCs, + HTN- will increase Lopressor to 25 mg BID, start Lisinopril at 5 mg daily 2. Pulm- no acute issues, continue IS, JP drains in place 300 cc output yesterday, leave in place today 3. Renal- creatinine improving, down to 1.20, weight remains above baseline, continue Lasix, potassium 4. Expected post operative blood loos anemia, mild Hgb at 11.8  5. GI- mild distention, hypoactive BS, will add reglan 6. Dispo-patient stable, increase BB, add Lisinopril for better BP control, reglan for GI motility continue current care   LOS: 3 days    Ellwood Handler 03/27/2019 Recovering well after emergency repair of aortic dissection Ambulating in the hallway with rehab Incisional pain improving, mediastinal drains remain in place Titrating blood pressure medicines to keep less than 595 systolic  patient examined and medical record reviewed,agree with above note. Tharon Aquas Trigt III 03/27/2019

## 2019-03-27 NOTE — Progress Notes (Signed)
Pt ambulated x 200 feet around unit on room air, slow steady gait

## 2019-03-28 LAB — BPAM RBC
Blood Product Expiration Date: 202009232359
Blood Product Expiration Date: 202009232359
Blood Product Expiration Date: 202009252359
Blood Product Expiration Date: 202009252359
Blood Product Expiration Date: 202009252359
Blood Product Expiration Date: 202009262359
Blood Product Expiration Date: 202009262359
Blood Product Expiration Date: 202009262359
ISSUE DATE / TIME: 202009030643
ISSUE DATE / TIME: 202009031009
ISSUE DATE / TIME: 202009031201
ISSUE DATE / TIME: 202009031246
Unit Type and Rh: 5100
Unit Type and Rh: 5100
Unit Type and Rh: 6200
Unit Type and Rh: 6200
Unit Type and Rh: 6200
Unit Type and Rh: 6200
Unit Type and Rh: 6200
Unit Type and Rh: 6200

## 2019-03-28 LAB — TYPE AND SCREEN
ABO/RH(D): A POS
Antibody Screen: NEGATIVE
Unit division: 0
Unit division: 0
Unit division: 0
Unit division: 0
Unit division: 0
Unit division: 0
Unit division: 0
Unit division: 0

## 2019-03-28 LAB — BASIC METABOLIC PANEL
Anion gap: 8 (ref 5–15)
BUN: 21 mg/dL — ABNORMAL HIGH (ref 6–20)
CO2: 26 mmol/L (ref 22–32)
Calcium: 7.9 mg/dL — ABNORMAL LOW (ref 8.9–10.3)
Chloride: 106 mmol/L (ref 98–111)
Creatinine, Ser: 0.85 mg/dL (ref 0.61–1.24)
GFR calc Af Amer: >60 mL/min (ref >60)
GFR calc non Af Amer: >60 mL/min (ref >60)
Glucose, Bld: 94 mg/dL (ref 70–99)
Potassium: 3.4 mmol/L — ABNORMAL LOW (ref 3.5–5.1)
Sodium: 140 mmol/L (ref 135–145)

## 2019-03-28 LAB — GLUCOSE, CAPILLARY
Glucose-Capillary: 92 mg/dL (ref 70–99)
Glucose-Capillary: 84 mg/dL (ref 70–99)
Glucose-Capillary: 103 mg/dL — ABNORMAL HIGH (ref 70–99)

## 2019-03-28 MED ORDER — LISINOPRIL 10 MG PO TABS
20.0000 mg | ORAL_TABLET | Freq: Every day | ORAL | Status: DC
  Administered 2019-03-28 – 2019-03-30 (×3): 20 mg via ORAL
  Filled 2019-03-28 (×4): qty 2

## 2019-03-28 MED ORDER — POTASSIUM CHLORIDE CRYS ER 20 MEQ PO TBCR
40.0000 meq | EXTENDED_RELEASE_TABLET | Freq: Once | ORAL | Status: AC
  Administered 2019-03-28: 40 meq via ORAL
  Filled 2019-03-28: qty 2

## 2019-03-28 MED ORDER — SORBITOL 70 % PO SOLN
30.0000 mL | Freq: Once | ORAL | Status: AC
  Administered 2019-03-28: 30 mL via ORAL
  Filled 2019-03-28: qty 30

## 2019-03-28 MED ORDER — POTASSIUM CHLORIDE CRYS ER 20 MEQ PO TBCR: 20.0000 meq | EXTENDED_RELEASE_TABLET | Freq: Every day | ORAL | Status: DC

## 2019-03-28 MED ORDER — HYDRALAZINE HCL 20 MG/ML IJ SOLN
10.0000 mg | Freq: Four times a day (QID) | INTRAMUSCULAR | Status: DC | PRN
  Administered 2019-03-28 – 2019-03-30 (×4): 10 mg via INTRAVENOUS
  Filled 2019-03-28 (×4): qty 1

## 2019-03-28 NOTE — Progress Notes (Signed)
   03/28/19 2305  Vitals  Temp 98.4 F (36.9 C)  Temp Source Oral  BP (!) 172/109  MAP (mmHg) 127  BP Location Right Arm  BP Method Manual  Patient Position (if appropriate) Lying  Pulse Rate 98  Pulse Rate Source Monitor  ECG Heart Rate 88  Cardiac Rhythm NSR  Resp 19  pt's BP elevated see above pain 3/10, medicated for pain and prn Hydralazine will continue to monitor

## 2019-03-28 NOTE — Progress Notes (Addendum)
      [address]       [place] [address]             [phone]      4 Days Post-Op Procedure(s) (LRB): THORACIC ASCENDING ANEURYSM REPAIR (AAA) (N/A)   Subjective:  No new complaints.  Continues to ambulate without much difficulty.  Has not yet moved his bowels.  Objective: Vital signs in last 24 hours: Temp:  [98 F (36.7 C)-98.2 F (36.8 C)] 98.1 F (36.7 C) (09/06 0808) Pulse Rate:  [74-89] 83 (09/06 0808) Cardiac Rhythm: Normal sinus rhythm (09/06 0437) Resp:  [17-19] 17 (09/05 2300) BP: (132-170)/(77-101) 167/95 (09/06 0808) SpO2:  [89 %-98 %] 90 % (09/06 0808) Weight:  [128.6 kg] 128.6 kg (09/06 0500)  Intake/Output from previous day: 09/05 0701 - 09/06 0700 In: 480 [P.O.:480] Out: 2105 [Urine:1850; Chest Tube:255] Intake/Output this shift: Total I/O In: 120 [P.O.:120] Out: 50 [Chest Tube:50]  General appearance: alert, cooperative and no distress Heart: regular rate and rhythm Lungs: clear to auscultation bilaterally Abdomen: soft, non-tender; bowel sounds normal; no masses,  no organomegaly Extremities: edema trace Wound: clean and dry  Lab Results: Recent Labs    03/26/19 1001 03/27/19 0315  WBC 18.0* 15.3*  HGB 12.3* 11.8*  HCT 37.8* 36.6*  PLT 135* 136*   BMET:  Recent Labs    03/27/19 0315 03/28/19 0423  NA 140 140  K 3.9 3.4*  CL 107 106  CO2 25 26  GLUCOSE 97 94  BUN 29* 21*  CREATININE 1.29* 0.85  CALCIUM 7.8* 7.9*    PT/INR: No results for input(s): LABPROT, INR in the last 72 hours. ABG    Component Value Date/Time   PHART 7.413 03/25/2019 1201   HCO3 23.7 03/25/2019 1201   TCO2 22 03/25/2019 1711   ACIDBASEDEF 1.0 03/25/2019 1201   O2SAT 95.0 03/25/2019 1201   CBG (last 3)  Recent Labs    03/27/19 1624 03/27/19 2053 03/28/19 0556  GLUCAP 93 102* 92    Assessment/Plan: S/P Procedure(s) (LRB): THORACIC ASCENDING ANEURYSM REPAIR (AAA) (N/A)  1. CV- NSR, + HTN- continue Lopressor, increase  Lisinopril to 20 mg daily.[name] add hydralazine prn patient was on BP meds prior to arrival, not sure what they were as he quit taking them 2. Pulm- CT output 255, will hopefully remove tomorrow, drainage needs to be less than 250 3. Renal- creatinine WNL, weight is remains elevated, but is trending down, continue Lasix 4. Hypokalemia, mild will supplement 5. CBGs controlled, not a diabetic, will d/c SSIP 6. GI- NO BM, reglan scheduled, lactulose prn added 7. Dispo- patient stable, need to get better control of BP, lisinopril titrated, supplement K, can hopefully d/c CTs tomorrow, possibly ready for d/c in next 24-48 hours pending Bp control, will d/c central line today  LOS: 4 days    Erin Barrett 03/28/2019  titrating BP meds DC JP drains tomorrow Walking in hall Chest incisions clean, dry  patient examined and medical record reviewed,agree with above note. Tharon Aquas Trigt III 03/28/2019

## 2019-03-28 NOTE — Progress Notes (Signed)
Pt ambulated x 370 feet around unit with rolling walker pt tolerated well  Will continue to monitor

## 2019-03-29 LAB — BASIC METABOLIC PANEL
Anion gap: 9 (ref 5–15)
BUN: 17 mg/dL (ref 6–20)
CO2: 26 mmol/L (ref 22–32)
Calcium: 8.2 mg/dL — ABNORMAL LOW (ref 8.9–10.3)
Chloride: 103 mmol/L (ref 98–111)
Creatinine, Ser: 0.76 mg/dL (ref 0.61–1.24)
GFR calc Af Amer: >60 mL/min (ref >60)
GFR calc non Af Amer: >60 mL/min (ref >60)
Glucose, Bld: 99 mg/dL (ref 70–99)
Potassium: 3.3 mmol/L — ABNORMAL LOW (ref 3.5–5.1)
Sodium: 138 mmol/L (ref 135–145)

## 2019-03-29 LAB — GLUCOSE, CAPILLARY: Glucose-Capillary: 97 mg/dL (ref 70–99)

## 2019-03-29 MED ORDER — AMLODIPINE BESYLATE 5 MG PO TABS
5.0000 mg | ORAL_TABLET | Freq: Every day | ORAL | Status: DC
  Administered 2019-03-29 – 2019-04-02 (×5): 5 mg via ORAL
  Filled 2019-03-29 (×5): qty 1

## 2019-03-29 MED ORDER — METOPROLOL TARTRATE 50 MG PO TABS
50.0000 mg | ORAL_TABLET | Freq: Two times a day (BID) | ORAL | Status: DC
  Administered 2019-03-29 (×2): 50 mg via ORAL
  Filled 2019-03-29 (×2): qty 1

## 2019-03-29 MED ORDER — POTASSIUM CHLORIDE CRYS ER 20 MEQ PO TBCR
20.0000 meq | EXTENDED_RELEASE_TABLET | Freq: Once | ORAL | Status: AC
  Administered 2019-03-29: 20 meq via ORAL
  Filled 2019-03-29: qty 1

## 2019-03-29 MED ORDER — POTASSIUM CHLORIDE CRYS ER 20 MEQ PO TBCR
40.0000 meq | EXTENDED_RELEASE_TABLET | Freq: Every day | ORAL | Status: DC
  Administered 2019-03-29 – 2019-03-31 (×3): 40 meq via ORAL
  Filled 2019-03-29 (×3): qty 2

## 2019-03-29 NOTE — Progress Notes (Addendum)
      [address]       [place] [address]             [phone]       5 Days Post-Op Procedure(s) (LRB): THORACIC ASCENDING ANEURYSM REPAIR (AAA) (N/A)   Subjective:  Patient with pain this morning.  Otherwise he is doing okay.  He is ambulating independently.  + BM  Objective: Vital signs in last 24 hours: Temp:  [97.8 F (36.6 C)-98.4 F (36.9 C)] 98 F (36.7 C) (09/07 0827) Pulse Rate:  [78-98] 86 (09/07 0827) Cardiac Rhythm: Normal sinus rhythm;Idioventricular (09/07 0503) Resp:  [18-19] 18 (09/07 0827) BP: (125-177)/(73-109) 160/94 (09/07 0827) SpO2:  [92 %-95 %] 93 % (09/07 0827) Weight:  [124.2 kg] 124.2 kg (09/07 0404)  Intake/Output from previous day: 09/06 0701 - 09/07 0700 In: 480 [P.O.:480] Out: 2800 [Urine:2575; Chest Tube:225]  General appearance: alert, cooperative and no distress Heart: regular rate and rhythm Lungs: clear to auscultation bilaterally Abdomen: soft, non-tender; bowel sounds normal; no masses,  no organomegaly Extremities: edema trace Wound: clean and dry  Lab Results: Recent Labs    03/26/19 1001 03/27/19 0315  WBC 18.0* 15.3*  HGB 12.3* 11.8*  HCT 37.8* 36.6*  PLT 135* 136*   BMET:  Recent Labs    03/28/19 0423 03/29/19 0118  NA 140 138  K 3.4* 3.3*  CL 106 103  CO2 26 26  GLUCOSE 94 99  BUN 21* 17  CREATININE 0.85 0.76  CALCIUM 7.9* 8.2*    PT/INR: No results for input(s): LABPROT, INR in the last 72 hours. ABG    Component Value Date/Time   PHART 7.413 03/25/2019 1201   HCO3 23.7 03/25/2019 1201   TCO2 22 03/25/2019 1711   ACIDBASEDEF 1.0 03/25/2019 1201   O2SAT 95.0 03/25/2019 1201   CBG (last 3)  Recent Labs    03/28/19 1111 03/28/19 2106 03/29/19 0624  GLUCAP 84 103* 97    Assessment/Plan: S/P Procedure(s) (LRB): THORACIC ASCENDING ANEURYSM REPAIR (AAA) (N/A)  1.  CV- NSR, + HTN- increase Lopressor to 50 mg BID, Lisinopril at 20 mg daily.. will increase lisinopril  tomorrow if needs additional control 2. Pulm- CT output 225 yesterday, will d/c drains today, get 2V CXR in AM 3. Renal- creatinine stable, weight is trending down continue Lasix 4. Hypokalemia- K is at 3.3, will increase potassium supplementation 5. GI- constipation resolved 6. Dispo- patient stable, increase Lopressor for additional BP control, d/c chest tubes, supplement K, will get 2V CXR in AM.. possibly ready for d/c in AM if BP is controlled   LOS: 5 days    Ellwood Handler, PA-C  03/29/2019  patient examined and medical record reviewed,agree with above note. Tharon Aquas Trigt III 03/29/2019

## 2019-03-30 ENCOUNTER — Inpatient Hospital Stay (HOSPITAL_COMMUNITY): Payer: Self-pay

## 2019-03-30 LAB — GLUCOSE, CAPILLARY: Glucose-Capillary: 103 mg/dL — ABNORMAL HIGH (ref 70–99)

## 2019-03-30 LAB — BASIC METABOLIC PANEL
Anion gap: 10 (ref 5–15)
BUN: 14 mg/dL (ref 6–20)
CO2: 25 mmol/L (ref 22–32)
Calcium: 8.8 mg/dL — ABNORMAL LOW (ref 8.9–10.3)
Chloride: 102 mmol/L (ref 98–111)
Creatinine, Ser: 0.69 mg/dL (ref 0.61–1.24)
GFR calc Af Amer: >60 mL/min (ref >60)
GFR calc non Af Amer: >60 mL/min (ref >60)
Glucose, Bld: 95 mg/dL (ref 70–99)
Potassium: 3.5 mmol/L (ref 3.5–5.1)
Sodium: 137 mmol/L (ref 135–145)

## 2019-03-30 LAB — CBC
HCT: 41.9 % (ref 39.0–52.0)
Hemoglobin: 14.1 g/dL (ref 13.0–17.0)
MCH: 29.3 pg (ref 26.0–34.0)
MCHC: 33.7 g/dL (ref 30.0–36.0)
MCV: 87.1 fL (ref 80.0–100.0)
Platelets: 247 10*3/uL (ref 150–400)
RBC: 4.81 MIL/uL (ref 4.22–5.81)
RDW: 13.2 % (ref 11.5–15.5)
WBC: 11.2 10*3/uL — ABNORMAL HIGH (ref 4.0–10.5)
nRBC: 0 % (ref 0.0–0.2)

## 2019-03-30 MED ORDER — ATORVASTATIN CALCIUM 40 MG PO TABS: 40.0000 mg | ORAL_TABLET | Freq: Every day | ORAL | 1 refills | Status: DC

## 2019-03-30 MED ORDER — METOPROLOL TARTRATE 50 MG PO TABS
75.0000 mg | ORAL_TABLET | Freq: Two times a day (BID) | ORAL | Status: DC
  Administered 2019-03-30: 75 mg via ORAL
  Filled 2019-03-30: qty 1

## 2019-03-30 MED ORDER — METOPROLOL TARTRATE 100 MG PO TABS
100.0000 mg | ORAL_TABLET | Freq: Two times a day (BID) | ORAL | Status: DC
  Administered 2019-03-30 – 2019-04-02 (×6): 100 mg via ORAL
  Filled 2019-03-30 (×6): qty 1

## 2019-03-30 MED ORDER — ASPIRIN 325 MG PO TBEC: 325.0000 mg | DELAYED_RELEASE_TABLET | Freq: Every day | ORAL | 0 refills | Status: DC

## 2019-03-30 MED ORDER — POTASSIUM CHLORIDE CRYS ER 20 MEQ PO TBCR
40.0000 meq | EXTENDED_RELEASE_TABLET | Freq: Once | ORAL | Status: AC
  Administered 2019-03-30: 40 meq via ORAL
  Filled 2019-03-30: qty 2

## 2019-03-30 MED FILL — Sodium Bicarbonate IV Soln 8.4%: INTRAVENOUS | Qty: 100 | Status: AC

## 2019-03-30 MED FILL — Sodium Chloride IV Soln 0.9%: INTRAVENOUS | Qty: 3000 | Status: AC

## 2019-03-30 MED FILL — Mannitol IV Soln 20%: INTRAVENOUS | Qty: 500 | Status: AC

## 2019-03-30 MED FILL — Heparin Sodium (Porcine) Inj 1000 Unit/ML: INTRAMUSCULAR | Qty: 10 | Status: AC

## 2019-03-30 MED FILL — Electrolyte-R (PH 7.4) Solution: INTRAVENOUS | Qty: 4000 | Status: AC

## 2019-03-30 NOTE — Progress Notes (Signed)
Holding ambulation today due to pts sx. Yves Dill CES, ACSM 2:41 PM 03/30/2019

## 2019-03-30 NOTE — Progress Notes (Signed)
Dr at bedside. Advises to continue to monitor pt vitals and symptoms. Encourage po intake. Will continue to monitor.  Jerald Kief, RN

## 2019-03-30 NOTE — Progress Notes (Signed)
I was asked to evaluate the patient earlier this am because he did not feel well. Patient had nausea, no emesis, and pain across chest (denies abdominal or back pain.) He felt dizzy while standing for x ray earlier this am and he has not eaten much last two days. He did have a bowel movement previously. His HR was in the low 100's and SBP 150-160's, despite Lopressor being increased to 75 mg bid. Dr. Kipp Brood later evaluated the patient. We increased his Lopressor to 100 mg bid. His silhouette on chest x ray appears similar to previous chest x rays. He was NOT hypoglycemic. He has been on Lovenox for DVT prophylaxis. As discussed with Dr. Kipp Brood, he does not feel he has evidence of a pericardial effusion/tamponade. Continue to monitor.

## 2019-03-30 NOTE — Progress Notes (Signed)
PA Jeffrey Flynn advised of pt c/o feeling dizzy/weak. Vitals obtained. BP 167/95. HR 94 Sat 93 r/a. PA advised Dr. Kipp Brood will see pt. Will continue to monitor.  Jerald Kief, RN

## 2019-03-30 NOTE — Progress Notes (Signed)
Left voicemail for Ester Mabe, pt's daughter. Will attempt contact later.  Jerald Kief, RN

## 2019-03-30 NOTE — Progress Notes (Addendum)
Patient with complaints of lightheadedness and dizzyness "stated it started when I was going for xray today" patient diaphoretic. States" he doesn't feel well." Vital signs obtained. BP 165/103 heart rate 94 oxygen 92 on room air. Patient appears sleepy. Blood sugar 103. Charge Nurse aware and in room Will monitor patient. Darbi Chandran, Bettina Gavia rN

## 2019-03-30 NOTE — Progress Notes (Addendum)
      [address]       [place] [address]             [phone]        6 Days Post-Op Procedure(s) (LRB): THORACIC ASCENDING ANEURYSM REPAIR (AAA) (N/A)  Subjective: Patient states had pain across chest and felt hot yesterday. He has "a little" numbness right hand  Objective: Vital signs in last 24 hours: Temp:  [97.8 F (36.6 C)-98.4 F (36.9 C)] 98.4 F (36.9 C) (09/08 0447) Pulse Rate:  [76-104] 104 (09/08 0447) Cardiac Rhythm: Normal sinus rhythm (09/07 2041) Resp:  [18-19] 19 (09/08 0447) BP: (148-185)/(73-101) 158/97 (09/08 0629) SpO2:  [92 %-96 %] 92 % (09/08 0447) Weight:  [120.3 kg] 120.3 kg (09/08 0447)  Pre op weight 117.9 kg Current Weight  03/30/19 120.3 kg      Intake/Output from previous day: 09/07 0701 - 09/08 0700 In: 240 [P.O.:240] Out: 2820 [Urine:2775; Chest Tube:45]   Physical Exam:  Cardiovascular: Slightly tachycardic, no murmur Pulmonary: Clear to auscultation bilaterally Abdomen: Soft, non tender, bowel sounds present. Extremities: Trace bilateral lower extremity edema. Wounds: Clean and dry.  No erythema or signs of infection.  Lab Results: CBC: Recent Labs    03/30/19 0345  WBC 11.2*  HGB 14.1  HCT 41.9  PLT 247   BMET:  Recent Labs    03/29/19 0118 03/30/19 0345  NA 138 137  K 3.3* 3.5  CL 103 102  CO2 26 25  GLUCOSE 99 95  BUN 17 14  CREATININE 0.76 0.69  CALCIUM 8.2* 8.8*    PT/INR:  Lab Results  Component Value Date   INR 1.5 (H) 03/25/2019   ABG:  INR: Will add last result for INR, ABG once components are confirmed Will add last 4 CBG results once components are confirmed  Assessment/Plan:  1. CV - ST and hypertensive this am. On Amlodipine 5 mg daily, Lisinopril 20 mg daily, Lopressor 50 mg bid. Will increase Lopressor to 75 mg bid. 2.  Pulmonary - On room air. CXR this am shows a small left pleural effusion, LLL atelectasis, and increased right base atelectasis. Encourage  incentive spirometer and flutter valve. 3. Volume Overload - On Lasix 40 mg daily 4. Supplement potassium 5. Made adjustment in BP medications so need BP improved prior to discharge.  Donielle M ZimmermanPA-C 03/30/2019,7:01 AM [phone]  Agree with above Not feeling well today Labs, and vitals stable Will continue to follow Encouraged PO intake.  Manda Holstad Bary Leriche

## 2019-03-31 MED ORDER — ACETAMINOPHEN 325 MG PO TABS
650.0000 mg | ORAL_TABLET | Freq: Four times a day (QID) | ORAL | Status: DC | PRN
  Administered 2019-03-31: 650 mg via ORAL
  Filled 2019-03-31: qty 2

## 2019-03-31 MED ORDER — LISINOPRIL 40 MG PO TABS
40.0000 mg | ORAL_TABLET | Freq: Every day | ORAL | Status: DC
  Administered 2019-03-31 – 2019-04-02 (×3): 40 mg via ORAL
  Filled 2019-03-31 (×3): qty 1

## 2019-03-31 NOTE — Progress Notes (Signed)
CARDIAC REHAB PHASE I   PRE:  Rate/Rhythm: 75 SR    BP: sitting 166/101    SaO2: 95 RA  MODE:  Ambulation: 270 ft   POST:  Rate/Rhythm: 95 SR    BP: sitting 177/105     SaO2: 96 RA  Pt just awoke and willing to walk. Needed coaching on how to turn and sit up independently (wanted to squeeze pillow and got stuck on his back). Stood and walked with RW. Used gait belt with him but he did not need any actual assist. (X2 to follow with rollator in case of dizziness). He stated he has a slight HA but no overt dizziness or diaphoresis. To recliner. BP elevated before and after walk in right arm. Will f/u. Encouraged another walk and IS.  [id number]  Darrick Meigs CES, ACSM 03/31/2019 1:43 PM

## 2019-03-31 NOTE — Progress Notes (Addendum)
      [address]       [place] [address]             [phone]        7 Days Post-Op Procedure(s) (LRB): THORACIC ASCENDING ANEURYSM REPAIR (AAA) (N/A)  Subjective: Patient states he had a better afternoon and evening;although, still with pain across his chest. Nurse reports he sweats and gets dizzy when gets up as well. He did eat lunch and dinner without problems.  Objective: Vital signs in last 24 hours: Temp:  [97.7 F (36.5 C)-98.5 F (36.9 C)] 98.1 F (36.7 C) (09/09 0553) Pulse Rate:  [88-105] 88 (09/09 0553) Cardiac Rhythm: Normal sinus rhythm;Sinus tachycardia (09/09 0350) Resp:  [18-20] 19 (09/09 0553) BP: (144-167)/(90-103) 160/95 (09/09 0553) SpO2:  [92 %-97 %] 95 % (09/09 0553) Weight:  [120.6 kg] 120.6 kg (09/09 0553)  Pre op weight 117.9 kg Current Weight  03/31/19 120.6 kg      Intake/Output from previous day: 09/08 0701 - 09/09 0700 In: -  Out: 900 [Urine:900]   Physical Exam:  Cardiovascular: Slightly tachycardic, no murmur Pulmonary: Clear to auscultation bilaterally Abdomen: Soft, non tender, bowel sounds present. Extremities: Trace bilateral lower extremity edema. Wounds: Clean and dry.  No erythema or signs of infection.  Lab Results: CBC: Recent Labs    03/30/19 0345  WBC 11.2*  HGB 14.1  HCT 41.9  PLT 247   BMET:  Recent Labs    03/29/19 0118 03/30/19 0345  NA 138 137  K 3.3* 3.5  CL 103 102  CO2 26 25  GLUCOSE 99 95  BUN 17 14  CREATININE 0.76 0.69  CALCIUM 8.2* 8.8*    PT/INR:  Lab Results  Component Value Date   INR 1.5 (H) 03/25/2019   ABG:  INR: Will add last result for INR, ABG once components are confirmed Will add last 4 CBG results once components are confirmed  Assessment/Plan:  1. CV - ST and hypertensive this am. On Amlodipine 5 mg daily, Lisinopril 20 mg daily, Lopressor 100 mg bid (received first dose of 100 mg last evening). Will increase Lisinopril for better BP control  and discuss with Dr. Kipp Brood. 2.  Pulmonary - On room air. Encourage incentive spirometer and flutter valve. 3. Volume Overload - On Lasix 40 mg daily 4. Once BP and HR better controlled, he will be ready for discharge  Sharalyn Ink ZimmermanPA-C 03/31/2019,7:35 AM [phone]  Feels better today titrating blood pressure medications Home soon Reia Viernes O Yitzel Shasteen

## 2019-04-01 MED ORDER — ACETAMINOPHEN 325 MG PO TABS: 650.0000 mg | ORAL_TABLET | Freq: Four times a day (QID) | ORAL | Status: DC | PRN

## 2019-04-01 MED ORDER — CALCIUM CARBONATE ANTACID 500 MG PO CHEW
1.0000 | CHEWABLE_TABLET | Freq: Three times a day (TID) | ORAL | Status: DC | PRN
  Administered 2019-04-01: 200 mg via ORAL
  Filled 2019-04-01: qty 1

## 2019-04-01 MED ORDER — POTASSIUM CHLORIDE CRYS ER 20 MEQ PO TBCR
20.0000 meq | EXTENDED_RELEASE_TABLET | Freq: Every day | ORAL | Status: DC
  Administered 2019-04-01 – 2019-04-02 (×2): 20 meq via ORAL
  Filled 2019-04-01: qty 1

## 2019-04-01 NOTE — Progress Notes (Addendum)
      [address]       [place] [address]             [phone]        8 Days Post-Op Procedure(s) (LRB): THORACIC ASCENDING ANEURYSM REPAIR (AAA) (N/A)  Subjective: Patient states he has been sitting in chair for awhile. He has a headache and felt a little dizzy earlier. He states overall, he is slowly feeling better.  Objective: Vital signs in last 24 hours: Temp:  [97.8 F (36.6 C)-98.4 F (36.9 C)] 98.3 F (36.8 C) (09/10 0431) Pulse Rate:  [30-106] 75 (09/10 0017) Cardiac Rhythm: Normal sinus rhythm;Bundle branch block (09/09 1942) Resp:  [13-18] 15 (09/10 0431) BP: (134-177)/(82-105) 146/82 (09/10 0431) SpO2:  [83 %-97 %] 96 % (09/10 0431) Weight:  [117.8 kg] 117.8 kg (09/10 0500)  Pre op weight 117.9 kg Current Weight  04/01/19 117.8 kg      Intake/Output from previous day: 09/09 0701 - 09/10 0700 In: 222 [P.O.:222] Out: 1340 [Urine:1340]   Physical Exam:  Cardiovascular: RRR, no murmur Pulmonary: Clear to auscultation bilaterally Abdomen: Soft, non tender, bowel sounds present. Extremities: Trace bilateral lower extremity edema. Wounds: Clean and dry.  No erythema or signs of infection.  Lab Results: CBC: Recent Labs    03/30/19 0345  WBC 11.2*  HGB 14.1  HCT 41.9  PLT 247   BMET:  Recent Labs    03/30/19 0345  NA 137  K 3.5  CL 102  CO2 25  GLUCOSE 95  BUN 14  CREATININE 0.69  CALCIUM 8.8*    PT/INR:  Lab Results  Component Value Date   INR 1.5 (H) 03/25/2019   INR  03/24/2019    PATIENT IDENTIFICATION ERROR. PLEASE DISREGARD RESULTS. ACCOUNT WILL BE CREDITED.   ABG:  INR: Will add last result for INR, ABG once components are confirmed Will add last 4 CBG results once components are confirmed  Assessment/Plan:  1. CV - SR and BP improved this am. On Amlodipine 5 mg daily, Lisinopril 40 mg daily, Lopressor 100 mg bid. 2.  Pulmonary - On room air. Encourage incentive spirometer and flutter valve. 3.  Volume Overload - On Lasix 40 mg daily 4. BP and HR much improved. Regarding headache and dizziness, likely related to better control of BP. He has likely been very hypertensive for quite some time. Possibly discharge in am  Donielle M ZimmermanPA-C 04/01/2019,7:09 AM [phone]  Agree with above. Feels better today Dispo planning.  Likely tomorrow Lajuana Matte

## 2019-04-01 NOTE — Progress Notes (Signed)
Pt walked approx 900 ft in halls, rolling walker. Standby assist. Pt tolerated walk well, assisted to chair.

## 2019-04-01 NOTE — Progress Notes (Signed)
CARDIAC REHAB PHASE I   PRE:  Rate/Rhythm: 90 SR PVCs  BP:  Supine:   Sitting: 160/116, 152/97  Standing:    SaO2: 95%RA  MODE:  Ambulation: 300 ft   POST:  Rate/Rhythm: 114 ST mutifocal PVCs  BP:  Supine: 176/90  Sitting:   Standing:    SaO2: 97%RA 0921-0950 Pt walked 300 ft on RA with rolling walker and asst x 1 with one asst to follow with rollator in case he needed to sit down. Pt denied dizziness but stated his vision is a little off which he thinks is from his high BP. Pt adheres to sternal precautions and staying in the tube. Has walker at home to use until he is stronger. Stated son and daughter in law will be with him to assist. Discussed watching sodium and heart healthy food choices. Reviewed walking instructions again and are written for family to see. Heart healthy diet given. Encouraged IS and flutter valve. Encouraged pt to have family members go outside to smoke so he is not around second hand smoke.   Graylon Good, RN BSN  04/01/2019 9:45 AM

## 2019-04-02 LAB — CREATININE, SERUM
Creatinine, Ser: 0.82 mg/dL (ref 0.61–1.24)
GFR calc Af Amer: >60 mL/min (ref >60)
GFR calc non Af Amer: >60 mL/min (ref >60)

## 2019-04-02 MED ORDER — ATORVASTATIN CALCIUM 40 MG PO TABS: 40.0000 mg | ORAL_TABLET | Freq: Every day | ORAL | 1 refills | Status: DC

## 2019-04-02 MED ORDER — OXYCODONE HCL 5 MG PO TABS: 5.0000 mg | ORAL_TABLET | ORAL | 0 refills | Status: DC | PRN

## 2019-04-02 MED ORDER — AMLODIPINE BESYLATE 5 MG PO TABS: 5.0000 mg | ORAL_TABLET | Freq: Every day | ORAL | 1 refills | Status: DC

## 2019-04-02 MED ORDER — LISINOPRIL 40 MG PO TABS: 40.0000 mg | ORAL_TABLET | Freq: Every day | ORAL | 1 refills | Status: DC

## 2019-04-02 MED ORDER — POTASSIUM CHLORIDE CRYS ER 20 MEQ PO TBCR: 20.0000 meq | EXTENDED_RELEASE_TABLET | Freq: Every day | ORAL | 0 refills | Status: DC

## 2019-04-02 MED ORDER — FUROSEMIDE 40 MG PO TABS: 40.0000 mg | ORAL_TABLET | Freq: Every day | ORAL | 0 refills | Status: DC

## 2019-04-02 MED ORDER — METOPROLOL TARTRATE 100 MG PO TABS: 100.0000 mg | ORAL_TABLET | Freq: Two times a day (BID) | ORAL | 1 refills | Status: DC

## 2019-04-02 NOTE — Progress Notes (Addendum)
      [address]       [place] [address]             [phone]        9 Days Post-Op Procedure(s) (LRB): THORACIC ASCENDING ANEURYSM REPAIR (AAA) (N/A)  Subjective: Patient finishing breakfast. He is slowly feeling better. He wants to go home.  Objective: Vital signs in last 24 hours: Temp:  [98.1 F (36.7 C)-98.2 F (36.8 C)] 98.1 F (36.7 C) (09/10 2329) Pulse Rate:  [79-99] 79 (09/10 2329) Cardiac Rhythm: Normal sinus rhythm (09/10 2005) Resp:  [16] 16 (09/10 1127) BP: (134-154)/(70-89) 149/78 (09/10 2329) SpO2:  [96 %-97 %] 96 % (09/10 2329) Weight:  [116.8 kg] 116.8 kg (09/11 0220)  Pre op weight 117.9 kg Current Weight  04/02/19 116.8 kg      Intake/Output from previous day: 09/10 0701 - 09/11 0700 In: 600 [P.O.:600] Out: 1725 [Urine:1725]   Physical Exam:  Cardiovascular: RRR, no murmur Pulmonary: Clear to auscultation bilaterally Abdomen: Soft, non tender, bowel sounds present. Extremities: Trace bilateral lower extremity edema. Wounds: Clean and dry.  No erythema or signs of infection.  Lab Results: CBC: No results for input(s): WBC, HGB, HCT, PLT in the last 72 hours. BMET:  Recent Labs    04/02/19 0348  CREATININE 0.82    PT/INR:  Lab Results  Component Value Date   INR 1.5 (H) 03/25/2019   INR  03/24/2019    PATIENT IDENTIFICATION ERROR. PLEASE DISREGARD RESULTS. ACCOUNT WILL BE CREDITED.   ABG:  INR: Will add last result for INR, ABG once components are confirmed Will add last 4 CBG results once components are confirmed  Assessment/Plan:  1. CV - Continues to maintain SR and BP improved (systolic in 751'Z). On Amlodipine 5 mg daily, Lisinopril 40 mg daily, Lopressor 100 mg bid. 2.  Pulmonary - On room air. Encourage incentive spirometer and flutter valve. 3. Volume Overload - On Lasix 40 mg daily. Will give a few more days then stop. 4. Discharge  Donielle M ZimmermanPA-C 04/02/2019,7:51 AM [phone]  Agree with above Home today F/u in 1 week

## 2019-04-02 NOTE — Progress Notes (Signed)
IV and telemetry discontinued at this time. CCMD notified. Discharge instructions reviewed with patient. All questions answered.   K. Starr Alaney Witter, RN 

## 2019-04-02 NOTE — Plan of Care (Signed)
Continue to monitor

## 2019-04-02 NOTE — Progress Notes (Signed)
CARDIAC REHAB PHASE I   Reviewed d/c instructions with pt. Encouraged continued IS use, walks, and sternal precautions. Reviewed restrictions, site care, and exercise guidelines. Pt denies questions or concerns. Pt states he feels much better and appetite is improving.   [id number] Rufina Falco, RN BSN 04/02/2019 8:37 AM

## 2019-04-02 NOTE — Plan of Care (Signed)
  Problem: Education: Goal: Knowledge of General Education information will improve Description Including pain rating scale, medication(s)/side effects and non-pharmacologic comfort measures Outcome: Progressing   Problem: Health Behavior/Discharge Planning: Goal: Ability to manage health-related needs will improve Outcome: Progressing   

## 2019-04-02 NOTE — Plan of Care (Signed)
Discharge to home °

## 2019-04-05 ENCOUNTER — Encounter: Payer: Self-pay | Admitting: Cardiology

## 2019-04-05 ENCOUNTER — Ambulatory Visit (INDEPENDENT_AMBULATORY_CARE_PROVIDER_SITE_OTHER): Payer: Self-pay | Admitting: Cardiology

## 2019-04-05 ENCOUNTER — Other Ambulatory Visit: Payer: Self-pay

## 2019-04-05 VITALS — BP 144/86 | HR 94 | Temp 98.4°F | Ht 73.0 in | Wt 257.6 lb

## 2019-04-05 DIAGNOSIS — I71019 Dissection of thoracic aorta, unspecified: Secondary | ICD-10-CM

## 2019-04-05 DIAGNOSIS — I7101 Dissection of thoracic aorta: Secondary | ICD-10-CM

## 2019-04-05 DIAGNOSIS — I10 Essential (primary) hypertension: Secondary | ICD-10-CM

## 2019-04-05 LAB — COMPREHENSIVE METABOLIC PANEL
ALT: 29 IU/L (ref 0–44)
AST: 22 IU/L (ref 0–40)
Albumin/Globulin Ratio: 1.3 (ref 1.2–2.2)
Albumin: 3.5 g/dL — ABNORMAL LOW (ref 3.8–4.9)
Alkaline Phosphatase: 100 IU/L (ref 39–117)
BUN/Creatinine Ratio: 21 — ABNORMAL HIGH (ref 9–20)
BUN: 16 mg/dL (ref 6–24)
Bilirubin Total: 0.4 mg/dL (ref 0.0–1.2)
CO2: 25 mmol/L (ref 20–29)
Calcium: 8.8 mg/dL (ref 8.7–10.2)
Chloride: 96 mmol/L (ref 96–106)
Creatinine, Ser: 0.78 mg/dL (ref 0.76–1.27)
GFR calc Af Amer: 117 mL/min/{1.73_m2} (ref >59)
GFR calc non Af Amer: 102 mL/min/{1.73_m2} (ref >59)
Globulin, Total: 2.8 g/dL (ref 1.5–4.5)
Glucose: 102 mg/dL — ABNORMAL HIGH (ref 65–99)
Potassium: 4.6 mmol/L (ref 3.5–5.2)
Sodium: 137 mmol/L (ref 134–144)
Total Protein: 6.3 g/dL (ref 6.0–8.5)

## 2019-04-05 LAB — LIPID PANEL
Chol/HDL Ratio: 3.2 ratio (ref 0.0–5.0)
Cholesterol, Total: 79 mg/dL — ABNORMAL LOW (ref 100–199)
HDL: 25 mg/dL — ABNORMAL LOW (ref >39)
LDL Chol Calc (NIH): 37 mg/dL (ref 0–99)
Triglycerides: 79 mg/dL (ref 0–149)
VLDL Cholesterol Cal: 17 mg/dL (ref 5–40)

## 2019-04-05 MED ORDER — AMLODIPINE BESYLATE 10 MG PO TABS: 10.0000 mg | ORAL_TABLET | Freq: Every day | ORAL | 3 refills | Status: DC

## 2019-04-05 NOTE — Progress Notes (Signed)
PCP: System, Provider Not In  Clinic Note: Chief Complaint  Patient presents with  . Hospitalization Follow-up    Type A Thoracic Aortic Dissection - Repair    HPI: Jeffrey Flynn is a 58 y.o. male with a PMH below who presents today for hospital follow-up for TYPE A THORACIC AORTIC DISSECTION.Jeffrey Flynn.  Jeffrey AlexanderRandy Edelson was recently admitted on March 24, 2019 in transfer from [facility] with an acute type a dissection.  He presented with chest pain radiating to his back along with bradycardia and hypotension. --> Underwent emergent repair by Dr. Cliffton AstersLightfoot.  28 mm Hemashield graft->  Did relatively well postop, was diuresed for volume overload. -> Noted to have significant hypotension.  Was titrated up on Lopressor and started on ACE inhibitor.  Studies Personally Reviewed - (if available, images/films reviewed: From Epic Chart or Care Everywhere)  Transesophageal Echo 03/24/2019: EF 55 to 60%.  No R WMA.  Not concentric LVH.  Presurgical imaging: Ascending aorta noted dissection present with mobilization flap of aortic root, did not appear to involve the sinus of Valsalva.  Aortic root diameter.  Dissection involving descending aorta involving ~70% of the cross-sectional area.  Postop aortic dissection limiting.  No aortic insufficiency.  Normal aortic valve and mitral valve.  Interval History: Jeffrey Flynn is presenting here for follow-up of his hospital.  He has not yet seen CT surgery since his discharge.  He has gradually build back his exercise level.  He is still walking but he is using a walker still because of some unsteady gait.  He is really just recently discharged from the hospital and and is hoping to gradually build back at this exercise level.  The only thing he really notes is limited blurred vision and some headaches.  Normal postop chest discomfort chest tightness or pressure.  No exertional dyspnea. Edema seems to be improving, and is ready to complete his Lasix. No PND, orthopnea.   No palpitations, lightheadedness, dizziness, weakness or syncope/near syncope. No TIA/amaurosis fugax symptoms. No melena, hematochezia, hematuria, or epstaxis. No claudication.  ROS: A comprehensive was performed. Review of Systems  Constitutional: Positive for malaise/fatigue (Still slowly building back up energy level.). Negative for weight loss.  HENT: Negative for congestion and nosebleeds.   Eyes: Positive for blurred vision and double vision.       Notes it worse when blood pressure is higher  Respiratory: Positive for cough and shortness of breath (Stable baseline). Negative for wheezing.   Cardiovascular: Positive for leg swelling (His legs ache at the end of the day after long day work.).  Gastrointestinal: Negative for blood in stool, heartburn, melena and nausea.  Genitourinary: Negative for hematuria.  Musculoskeletal: Negative for joint pain.       Legs ache at the end of the day  Neurological: Positive for dizziness (If he stands up fast). Negative for focal weakness and weakness.  Psychiatric/Behavioral: Negative for memory loss. The patient is not nervous/anxious and does not have insomnia.    He has an Epworth score of 14. -->  We will need to discuss potential OSA evaluation in next follow-up.  Epworth Sleepiness Scale: Situation   Chance of Dozing/Sleeping (0 = never , 1 = slight chance , 2 = moderate chance , 3 = high chance )   sitting and reading 2   watching TV 2   sitting inactive in a public place 2   being a passenger in a motor vehicle for an hour or more 2   lying  down in the afternoon 3   sitting and talking to someone 0   sitting quietly after lunch (no alcohol) 3   while stopped for a few minutes in traffic as the driver 0   Total Score  14    I have reviewed and (if needed) personally updated the patient's problem list, medications, allergies, past medical and surgical history, social and family history.   Past Medical History:  Diagnosis Date   . Dysphagia    Status post endoscopic dilation  . History of pneumothorax   . Hypertension   . Thoracic aortic dissection  - TYPE A 03/24/2019   s/p Open Repair - 28 mm Hemashield graft (Dr. Cliffton AstersLightfoot)    Past Surgical History:  Procedure Laterality Date  . THORACIC AORTIC ANEURYSM REPAIR N/A 03/24/2019   Procedure: THORACIC ASCENDING ANEURYSM REPAIR (AAA);  Surgeon: Corliss SkainsLightfoot, Harrell O, MD;  [facility];  Service: Open Heart Surgery;  Laterality: N/A;  . TRANSESOPHAGEAL ECHOCARDIOGRAM  03/24/2019   Intra-OP: EF 55-50%. No RWMA.  Non-concentric LVH.  Pre-surgical: Ascending Aorta noted dissection present w/ mobilization flap of Aortic Root, NOT involving Sinus of Valsalva. 70% of cross-sectional area of descending TAA. POST-REPAIR: NO AI (normal AO-Valve)    Current Meds  Medication Sig  . acetaminophen (TYLENOL) 325 MG tablet Take 2 tablets (650 mg total) by mouth every 6 (six) hours as needed for mild pain, fever or headache.  Jeffrey Flynn. aspirin EC 325 MG EC tablet Take 1 tablet (325 mg total) by mouth daily.  Jeffrey Flynn. atorvastatin (LIPITOR) 40 MG tablet Take 1 tablet (40 mg total) by mouth daily at 6 PM.  . furosemide (LASIX) 40 MG tablet Take 1 tablet (40 mg total) by mouth daily. For 4 days then stop.  Jeffrey Flynn. lisinopril (ZESTRIL) 40 MG tablet Take 1 tablet (40 mg total) by mouth daily.  . metoprolol tartrate (LOPRESSOR) 100 MG tablet Take 1 tablet (100 mg total) by mouth 2 (two) times daily.  Jeffrey Flynn. oxyCODONE (OXY IR/ROXICODONE) 5 MG immediate release tablet Take 1 tablet (5 mg total) by mouth every 4 (four) hours as needed for severe pain.  . potassium chloride SA (K-DUR) 20 MEQ tablet Take 1 tablet (20 mEq total) by mouth daily. For 4 days then stop.  . [DISCONTINUED] amLODipine (NORVASC) 5 MG tablet Take 1 tablet (5 mg total) by mouth daily.    No Known Allergies  Social History   Tobacco Use  . Smoking status: Former Smoker    Packs/day: 1.00    Years: 5.00    Pack years: 5.00    Types:  Cigarettes    Quit date: 03/26/2009    Years since quitting: 10.0  . Smokeless tobacco: Never Used  Substance Use Topics  . Alcohol use: Not on file  . Drug use: Not on file   Social History   Social History Narrative  . Not on file    family history includes Bone cancer in his father; Cancer in his brother and brother; Diabetes type II in his paternal grandfather; Heart attack in his maternal grandmother and mother; Hypertension in his brother, mother, sister, sister, and sister; Neuropathy in his brother; Ovarian cancer in his sister; Prostate cancer in his maternal grandfather; Stroke in his brother.  Wt Readings from Last 3 Encounters:  04/05/19 257 lb 9.6 oz (116.8 kg)  04/02/19 257 lb 8 oz (116.8 kg)  03/24/19 260 lb (117.9 kg)    PHYSICAL EXAM BP (!) 144/86   Pulse 94   Temp  98.4 F (36.9 C)   Ht 6\' 1"  (1.854 m)   Wt 257 lb 9.6 oz (116.8 kg)   SpO2 96%   BMI 33.99 kg/m  Physical Exam  Constitutional: He is oriented to person, place, and time. He appears well-developed and well-nourished. No distress.  Healthy-appearing.  Well-groomed  HENT:  Head: Normocephalic and atraumatic.  Neck: Normal range of motion. Neck supple. No hepatojugular reflux and no JVD (Right IJ site clean dry and intact) present. Carotid bruit is not present.  Cardiovascular: Normal rate, regular rhythm, S1 normal, S2 normal and intact distal pulses.  Occasional extrasystoles are present. PMI is not displaced. Exam reveals friction rub (Soft postop). Exam reveals no gallop.  No murmur heard. Pulmonary/Chest: Effort normal. No respiratory distress. He has no wheezes. He has no rales.  Well-healed sternotomy scar.  Abdominal: Soft. Bowel sounds are normal. He exhibits no distension. There is no abdominal tenderness. There is no rebound.  No HSM.  Musculoskeletal: Normal range of motion.        General: Edema (Trivial) present.  Neurological: He is alert and oriented to person, place, and time.   Psychiatric: He has a normal mood and affect. His behavior is normal. Judgment and thought content normal.  Vitals reviewed.    Adult ECG Report Not checked  Other studies Reviewed: Additional studies/ records that were reviewed today include:  Recent Labs:   Lab Results  Component Value Date   CHOL 79 (L) 04/05/2019   HDL 25 (L) 04/05/2019   TRIG 79 04/05/2019   CHOLHDL 3.2 04/05/2019   Lab Results  Component Value Date   CREATININE 0.78 04/05/2019   BUN 16 04/05/2019   NA 137 04/05/2019   K 4.6 04/05/2019   CL 96 04/05/2019   CO2 25 04/05/2019   No results found for: HGBA1C   ASSESSMENT / PLAN: Problem List Items Addressed This Visit    Dissection of thoracic aorta -- s/p Hemasheild repair, Normal Aortic Valve. - Primary (Chronic)    S/p TAA Dissection Repair -- relatively stable post-operative progression.    Due for f/u with CVTS for timing re: driving & exercise.  Will need to determine f/u imaging timing.  Slowly build up exercise level.   Aggressive BP & Lipid control -> check lipid panel and chemistries.      Relevant Medications   amLODipine (NORVASC) 10 MG tablet   Other Relevant Orders   Lipid panel (Completed)   Comprehensive metabolic panel (Completed)   Essential hypertension (Chronic)    BP still a bit high - want to target close to 120-130 mmHg SBP. He is already on max dose of metoprolol and lisinopril.  Will increase amlodipine dose. Next option will be to consider adding HCTZ or chlorthalidone.  Plan: Increase Amlodipine to 10 mg       Relevant Medications   amLODipine (NORVASC) 10 MG tablet      I spent a total of 35-45 minutes with the patient and chart review. >  50% of the time was spent in direct patient consultation.   Current medicines are reviewed at length with the patient today.  (+/- concerns) none The following changes have been made:  see below.  Patient Instructions  Medication Instructions:  - INCREASE AMLODIPINE TO  10 MG  DAILY - NEW PRESCRIPTION SENT TO PHARMACY  If you need a refill on your cardiac medications before your next appointment, please call your pharmacy.   Lab work: CMP LIPID TODAY  If you have  labs (blood work) drawn today and your tests are completely normal, you will receive your results only by: Jeffrey Flynn MyChart Message (if you have MyChart) OR . A paper copy in the mail If you have any lab test that is abnormal or we need to change your treatment, we will call you to review the results.  Testing/Procedures: NOT NEEDED  Follow-Up: At [facility], you and your health needs are our priority.  As part of our continuing mission to provide you with exceptional heart care, we have created designated Provider Care Teams.  These Care Teams include your primary Cardiologist (physician) and Advanced Practice Providers (APPs -  Physician Assistants and Nurse Practitioners) who all work together to provide you with the care you need, when you need it. You will need a follow up appointment in 3 months- DEC 2020.  Please call our office 2 months in advance to schedule this appointment.  You may see Glenetta Hew, MD     Your physician recommends that you schedule a follow-up appointment in 1 month with CVRR-NORRTHLINE - BLOOD PRESSURE VISIT    Any Other Special Instructions Will Be Listed Below (If Applicable).    Studies Ordered:   Orders Placed This Encounter  Procedures  . Lipid panel  . Comprehensive metabolic panel      Glenetta Hew, M.D., M.S. Interventional Cardiologist   Pager # [phone] Phone # [phone] [address] [place], [place] [address]   Thank you for choosing Heartcare at [facility]!!

## 2019-04-05 NOTE — Patient Instructions (Addendum)
Medication Instructions:  - INCREASE AMLODIPINE TO 10 MG  DAILY - NEW PRESCRIPTION SENT TO PHARMACY  If you need a refill on your cardiac medications before your next appointment, please call your pharmacy.   Lab work: [place]  If you have labs (blood work) drawn today and your tests are completely normal, you will receive your results only by: [name] MyChart Message (if you have MyChart) OR . A paper copy in the mail If you have any lab test that is abnormal or we need to change your treatment, we will call you to review the results.  Testing/Procedures: NOT NEEDED  Follow-Up: At [facility], you and your health needs are our priority.  As part of our continuing mission to provide you with exceptional heart care, we have created designated Provider Care Teams.  These Care Teams include your primary Cardiologist (physician) and Advanced Practice Providers (APPs -  Physician Assistants and Nurse Practitioners) who all work together to provide you with the care you need, when you need it. You will need a follow up appointment in 3 months- DEC 2020.  Please call our office 2 months in advance to schedule this appointment.  You may see Glenetta Hew, MD     Your physician recommends that you schedule a follow-up appointment in 1 month with CVRR-NORRTHLINE - BLOOD PRESSURE VISIT    Any Other Special Instructions Will Be Listed Below (If Applicable).

## 2019-04-06 ENCOUNTER — Telehealth: Payer: Self-pay | Admitting: *Deleted

## 2019-04-06 ENCOUNTER — Encounter: Payer: Self-pay | Admitting: Cardiology

## 2019-04-06 DIAGNOSIS — I10 Essential (primary) hypertension: Secondary | ICD-10-CM | POA: Insufficient documentation

## 2019-04-06 NOTE — Assessment & Plan Note (Signed)
S/p TAA Dissection Repair -- relatively stable post-operative progression.    Due for f/u with CVTS for timing re: driving & exercise.  Will need to determine f/u imaging timing.  Slowly build up exercise level.   Aggressive BP & Lipid control -> check lipid panel and chemistries.

## 2019-04-06 NOTE — Assessment & Plan Note (Deleted)
S/p TAA Dissection Repair -- relatively stable post-operative progression.    Due for f/u with CVTS for timing re: driving & exercise.  Will need to determine f/u imaging timing.  Slowly build up exercise level.   Aggressive BP & Lipid control

## 2019-04-06 NOTE — Assessment & Plan Note (Signed)
BP still a bit high - want to target close to 120-130 mmHg SBP. He is already on max dose of metoprolol and lisinopril.  Will increase amlodipine dose. Next option will be to consider adding HCTZ or chlorthalidone.  Plan: Increase Amlodipine to 10 mg

## 2019-04-06 NOTE — Telephone Encounter (Signed)
Mr. Dethloff' daughter-in-law had called the answering service to relate a blood sugar of 148.  I called her but was unable to leave a message.  I called Mr. Votta to inquire when the test was done.  He said it was done last night.  He had seen Dr. Ellyn Hack yesterday and  CMP was done around 9am and it was 102.  Dr. Ellyn Hack commented on the result as being prediabetic.  I advised him to call Dr. Ellyn Hack with these results since he does not have a PCP (NO INS.) or have his daughter-in-law call us and we would discuss,  He agreed.

## 2019-04-07 ENCOUNTER — Telehealth: Payer: Self-pay | Admitting: Cardiology

## 2019-04-07 DIAGNOSIS — R7309 Other abnormal glucose: Secondary | ICD-10-CM

## 2019-04-07 DIAGNOSIS — I10 Essential (primary) hypertension: Secondary | ICD-10-CM

## 2019-04-07 NOTE — Telephone Encounter (Signed)
Spoke to pt's daughter-in-law about diabetic concerns. She stated that she used her brother's glucometer to check the pt's sugar because he was having double vision after his appt with Dr. Ellyn Hack on 9/14. She stated his sugar ws 148 and after he eats his meals it got as high as 161 yesterday 9/15. Advised the daughter-in-law that Dr. Ellyn Hack does not cover diabetic issues and to try to find a PCP. After reading the notes, pt does not have insurance, referred pt to call [facility] to make an appt. Educated the daughter-in-law the signs and symptoms of hyper/hypoglycemia and told her if she thinks the pt is having these issues to call 911 or go to nearest ER. Mention to daughter-in-law this message would be seen by Dr. Ellyn Hack to make him aware and to see if he had any more advise.

## 2019-04-07 NOTE — Telephone Encounter (Signed)
New Message   Patients daughter in law Velna Hatchet is calling on behalf of the patient. She states that the spoke with Dr. Kipp Brood yesterday about his blood sugar level. She states that she was told to contact Dr. Ellyn Hack today to advise since he does not have a PCP on file. Please call to discuss. You may have to call [place] call [place] not go through.

## 2019-04-07 NOTE — Telephone Encounter (Signed)
° ° °  Brandi calling to report patient has dizziness, lightheaded, sweating.  BP 150/98 HR 70 Blood sugar 133

## 2019-04-07 NOTE — Telephone Encounter (Signed)
I do not see any indication that he actually had diabetes.  We can check a hemoglobin A1c only see him in follow-up just to make sure.  Glenetta Hew, MD

## 2019-04-07 NOTE — Telephone Encounter (Signed)
Called and spoke to patient daughter in law, no chest pain, shortness of breath, swelling any other cardiac symptoms at this time.  She states he has taken his blood pressure medications and all his other medication this morning already. She was advised if blood sugar continues to jump around and be elevated to be evaluated, she states they are in the process of having a PCP look at him- advised her that sounded like a good plan to have someone look over him.  Advised I would route message over to Dr.Harding to make him aware.  They were thankful for the call.

## 2019-04-08 ENCOUNTER — Other Ambulatory Visit: Payer: Self-pay | Admitting: Thoracic Surgery (Cardiothoracic Vascular Surgery)

## 2019-04-08 DIAGNOSIS — I7101 Dissection of thoracic aorta: Secondary | ICD-10-CM

## 2019-04-08 DIAGNOSIS — I71019 Dissection of thoracic aorta, unspecified: Secondary | ICD-10-CM

## 2019-04-08 NOTE — Telephone Encounter (Signed)
Called daughter, LVM advising of message for the need for blood work. A1C ordered.

## 2019-04-09 ENCOUNTER — Other Ambulatory Visit: Payer: Self-pay

## 2019-04-09 ENCOUNTER — Emergency Department (HOSPITAL_COMMUNITY): Payer: Self-pay

## 2019-04-09 ENCOUNTER — Encounter (HOSPITAL_COMMUNITY): Payer: Self-pay | Admitting: Emergency Medicine

## 2019-04-09 ENCOUNTER — Emergency Department (HOSPITAL_COMMUNITY)
Admission: EM | Admit: 2019-04-09 | Discharge: 2019-04-09 | Disposition: A | Discharge status: Home / Self Care | Payer: Self-pay | Attending: Emergency Medicine | Admitting: Emergency Medicine

## 2019-04-09 ENCOUNTER — Ambulatory Visit: Payer: Self-pay | Admitting: Thoracic Surgery (Cardiothoracic Vascular Surgery)

## 2019-04-09 DIAGNOSIS — Z79899 Other long term (current) drug therapy: Secondary | ICD-10-CM | POA: Insufficient documentation

## 2019-04-09 DIAGNOSIS — R079 Chest pain, unspecified: Secondary | ICD-10-CM | POA: Insufficient documentation

## 2019-04-09 DIAGNOSIS — Z87891 Personal history of nicotine dependence: Secondary | ICD-10-CM | POA: Insufficient documentation

## 2019-04-09 DIAGNOSIS — I10 Essential (primary) hypertension: Secondary | ICD-10-CM | POA: Insufficient documentation

## 2019-04-09 DIAGNOSIS — R42 Dizziness and giddiness: Secondary | ICD-10-CM | POA: Insufficient documentation

## 2019-04-09 DIAGNOSIS — Z7982 Long term (current) use of aspirin: Secondary | ICD-10-CM | POA: Insufficient documentation

## 2019-04-09 LAB — URINALYSIS, ROUTINE W REFLEX MICROSCOPIC
Bilirubin Urine: NEGATIVE
Glucose, UA: NEGATIVE mg/dL
Hgb urine dipstick: NEGATIVE
Ketones, ur: NEGATIVE mg/dL
Leukocytes,Ua: NEGATIVE
Nitrite: NEGATIVE
Protein, ur: NEGATIVE mg/dL
Specific Gravity, Urine: 1.018 (ref 1.005–1.030)
pH: 6 (ref 5.0–8.0)

## 2019-04-09 LAB — CBC WITH DIFFERENTIAL/PLATELET
Abs Immature Granulocytes: 0.14 10*3/uL — ABNORMAL HIGH (ref 0.00–0.07)
Basophils Absolute: 0.1 10*3/uL (ref 0.0–0.1)
Basophils Relative: 1 %
Eosinophils Absolute: 0.1 10*3/uL (ref 0.0–0.5)
Eosinophils Relative: 1 %
HCT: 41.2 % (ref 39.0–52.0)
Hemoglobin: 13.3 g/dL (ref 13.0–17.0)
Immature Granulocytes: 1 %
Lymphocytes Relative: 7 %
Lymphs Abs: 0.9 10*3/uL (ref 0.7–4.0)
MCH: 28.4 pg (ref 26.0–34.0)
MCHC: 32.3 g/dL (ref 30.0–36.0)
MCV: 88 fL (ref 80.0–100.0)
Monocytes Absolute: 1.4 10*3/uL — ABNORMAL HIGH (ref 0.1–1.0)
Monocytes Relative: 10 %
Neutro Abs: 11.4 10*3/uL — ABNORMAL HIGH (ref 1.7–7.7)
Neutrophils Relative %: 80 %
Platelets: 511 10*3/uL — ABNORMAL HIGH (ref 150–400)
RBC: 4.68 MIL/uL (ref 4.22–5.81)
RDW: 12.9 % (ref 11.5–15.5)
WBC: 14 10*3/uL — ABNORMAL HIGH (ref 4.0–10.5)
nRBC: 0 % (ref 0.0–0.2)

## 2019-04-09 LAB — COMPREHENSIVE METABOLIC PANEL
ALT: 22 U/L (ref 0–44)
AST: 20 U/L (ref 15–41)
Albumin: 2.9 g/dL — ABNORMAL LOW (ref 3.5–5.0)
Alkaline Phosphatase: 93 U/L (ref 38–126)
Anion gap: 11 (ref 5–15)
BUN: 15 mg/dL (ref 6–20)
CO2: 26 mmol/L (ref 22–32)
Calcium: 8.9 mg/dL (ref 8.9–10.3)
Chloride: 101 mmol/L (ref 98–111)
Creatinine, Ser: 0.92 mg/dL (ref 0.61–1.24)
GFR calc Af Amer: >60 mL/min (ref >60)
GFR calc non Af Amer: >60 mL/min (ref >60)
Glucose, Bld: 110 mg/dL — ABNORMAL HIGH (ref 70–99)
Potassium: 3.8 mmol/L (ref 3.5–5.1)
Sodium: 138 mmol/L (ref 135–145)
Total Bilirubin: 0.4 mg/dL (ref 0.3–1.2)
Total Protein: 6.4 g/dL — ABNORMAL LOW (ref 6.5–8.1)

## 2019-04-09 LAB — CBG MONITORING, ED: Glucose-Capillary: 106 mg/dL — ABNORMAL HIGH (ref 70–99)

## 2019-04-09 MED ORDER — SODIUM CHLORIDE 0.9 % IV BOLUS
1000.0000 mL | Freq: Once | INTRAVENOUS | Status: AC
  Administered 2019-04-09: 1000 mL via INTRAVENOUS

## 2019-04-09 MED ORDER — IOHEXOL 350 MG/ML SOLN
100.0000 mL | Freq: Once | INTRAVENOUS | Status: AC | PRN
  Administered 2019-04-09: 10:00:00 100 mL via INTRAVENOUS

## 2019-04-09 MED ORDER — MECLIZINE HCL 25 MG PO TABS: 25.0000 mg | ORAL_TABLET | Freq: Three times a day (TID) | ORAL | 0 refills | Status: DC | PRN

## 2019-04-09 MED ORDER — MECLIZINE HCL 25 MG PO TABS
25.0000 mg | ORAL_TABLET | Freq: Once | ORAL | Status: AC
  Administered 2019-04-09: 11:00:00 25 mg via ORAL
  Filled 2019-04-09: qty 1

## 2019-04-09 NOTE — Discharge Instructions (Signed)
If you develop severe headache, vomiting, chest pain, double vision, trouble breathing, weakness or numbness in your arms or legs, or any other new/concerning symptoms then return to the ER for evaluation.

## 2019-04-09 NOTE — ED Notes (Signed)
Ambulated pt in hallway (with steady gait), pt had no complaints of dizziness or lightheadedness.

## 2019-04-09 NOTE — ED Notes (Signed)
Pt stated that he was too dizzy to stand.

## 2019-04-09 NOTE — ED Provider Notes (Signed)
[facility] EMERGENCY DEPARTMENT Provider Note   CSN: [id number] Arrival date & time: 04/09/19  4098     History   Chief Complaint Chief Complaint  Patient presents with   Dizziness    HPI Jeffrey Flynn is a 58 y.o. male.     HPI  58 year old male presents with dizziness.  Today is the fourth episode he has had since aortic dissection surgery.  He states that he had a couple in the hospital and then 2 over the last several days.  This morning a couple hours ago he was lying in bed watching TV when he felt acutely dizzy.  He describes this as both spinning and feeling like he is going to pass out.  Still feels a little bit off but the majority of his symptoms have improved.  Lasted about 30 minutes a few days ago in a similar fashion.  He gets a light headache when this happens but no severe or thunderclap headache.  No chest pain, shortness of breath, palpitations.  He is on multiple new medications for his blood pressure.  When they checked his blood pressure during an episode a couple days ago it was 150/90.  Past Medical History:  Diagnosis Date   Dysphagia    Status post endoscopic dilation   History of pneumothorax    Hypertension    Thoracic aortic dissection  - TYPE A 03/24/2019   s/p Open Repair - 28 mm Hemashield graft (Dr. Kipp Brood)    Patient Active Problem List   Diagnosis Date Noted   Essential hypertension 04/06/2019   Dissection of thoracic aorta -- s/p Hemasheild repair, Normal Aortic Valve. 03/24/2019    Past Surgical History:  Procedure Laterality Date   THORACIC AORTIC ANEURYSM REPAIR N/A 03/24/2019   Procedure: THORACIC ASCENDING ANEURYSM REPAIR (AAA);  Surgeon: Lajuana Matte, MD;  Location: [place];  Service: Open Heart Surgery;  Laterality: N/A;   TRANSESOPHAGEAL ECHOCARDIOGRAM  03/24/2019   Intra-OP: EF 55-50%. No RWMA.  Non-concentric LVH.  Pre-surgical: Ascending Aorta noted dissection present w/ mobilization flap of  Aortic Root, NOT involving Sinus of Valsalva. 70% of cross-sectional area of descending TAA. POST-REPAIR: NO AI (normal AO-Valve)        Home Medications    Prior to Admission medications   Medication Sig Start Date End Date Taking? Authorizing Provider  acetaminophen (TYLENOL) 325 MG tablet Take 2 tablets (650 mg total) by mouth every 6 (six) hours as needed for mild pain, fever or headache. 04/01/19  Yes Lars Pinks M, PA-C  amLODipine (NORVASC) 10 MG tablet Take 1 tablet (10 mg total) by mouth daily. 04/05/19 07/04/19 Yes Leonie Man, MD  aspirin EC 325 MG EC tablet Take 1 tablet (325 mg total) by mouth daily. 03/31/19  Yes Lars Pinks M, PA-C  atorvastatin (LIPITOR) 40 MG tablet Take 1 tablet (40 mg total) by mouth daily at 6 PM. 04/02/19  Yes Lars Pinks M, PA-C  lisinopril (ZESTRIL) 40 MG tablet Take 1 tablet (40 mg total) by mouth daily. 04/02/19  Yes Lars Pinks M, PA-C  metoprolol tartrate (LOPRESSOR) 100 MG tablet Take 1 tablet (100 mg total) by mouth 2 (two) times daily. 04/02/19  Yes Lars Pinks M, PA-C  oxyCODONE (OXY IR/ROXICODONE) 5 MG immediate release tablet Take 1 tablet (5 mg total) by mouth every 4 (four) hours as needed for severe pain. 04/02/19  Yes Lars Pinks M, PA-C  furosemide (LASIX) 40 MG tablet Take 1 tablet (40 mg total) by  mouth daily. For 4 days then stop. Patient not taking: Reported on 04/09/2019 04/02/19   Ardelle Balls, PA-C  meclizine (ANTIVERT) 25 MG tablet Take 1 tablet (25 mg total) by mouth 3 (three) times daily as needed for dizziness. 04/09/19   Pricilla Loveless, MD  potassium chloride SA (K-DUR) 20 MEQ tablet Take 1 tablet (20 mEq total) by mouth daily. For 4 days then stop. Patient not taking: Reported on 04/09/2019 04/02/19   Ardelle Balls, PA-C    Family History Family History  Problem Relation Age of Onset   Hypertension Mother    Heart attack Mother    Bone cancer Father     Ovarian cancer Sister    Stroke Brother    Heart attack Maternal Grandmother    Prostate cancer Maternal Grandfather    Diabetes type II Paternal Grandfather    Cancer Brother    Neuropathy Brother    Cancer Brother    Hypertension Brother    Hypertension Sister    Hypertension Sister    Hypertension Sister     Social History Social History   Tobacco Use   Smoking status: Former Smoker    Packs/day: 1.00    Years: 5.00    Pack years: 5.00    Types: Cigarettes    Quit date: 03/26/2009    Years since quitting: 10.0   Smokeless tobacco: Never Used  Substance Use Topics   Alcohol use: Not on file   Drug use: Not on file     Allergies   Patient has no known allergies.   Review of Systems Review of Systems  Constitutional: Negative for fever.  Respiratory: Negative for shortness of breath.   Cardiovascular: Negative for chest pain.  Gastrointestinal: Negative for abdominal pain and vomiting.  Neurological: Positive for dizziness and light-headedness.  All other systems reviewed and are negative.    Physical Exam Updated Vital Signs BP (!) 146/91    Pulse 89    Temp 98.4 F (36.9 C) (Oral)    Resp 17    Ht 6\' 1"  (1.854 m)    Wt 116.6 kg    SpO2 96%    BMI 33.91 kg/m   Physical Exam Vitals signs and nursing note reviewed.  Constitutional:      Appearance: He is well-developed.  HENT:     Head: Normocephalic and atraumatic.     Right Ear: External ear normal.     Left Ear: External ear normal.     Nose: Nose normal.  Eyes:     General:        Right eye: No discharge.        Left eye: No discharge.  Neck:     Musculoskeletal: Neck supple.  Cardiovascular:     Rate and Rhythm: Normal rate and regular rhythm.     Heart sounds: Murmur present.  Pulmonary:     Effort: Pulmonary effort is normal.     Breath sounds: Normal breath sounds.  Chest:    Abdominal:     Palpations: Abdomen is soft.     Tenderness: There is no abdominal tenderness.      Skin:    General: Skin is warm and dry.  Neurological:     Mental Status: He is alert.     Comments: CN 3-12 grossly intact. 5/5 strength in all 4 extremities. Grossly normal sensation. Normal finger to nose.   Psychiatric:        Mood and Affect: Mood is not anxious.  ED Treatments / Results  Labs (all labs ordered are listed, but only abnormal results are displayed) Labs Reviewed  COMPREHENSIVE METABOLIC PANEL - Abnormal; Notable for the following components:      Result Value   Glucose, Bld 110 (*)    Total Protein 6.4 (*)    Albumin 2.9 (*)    All other components within normal limits  CBC WITH DIFFERENTIAL/PLATELET - Abnormal; Notable for the following components:   WBC 14.0 (*)    Platelets 511 (*)    Neutro Abs 11.4 (*)    Monocytes Absolute 1.4 (*)    Abs Immature Granulocytes 0.14 (*)    All other components within normal limits  CBG MONITORING, ED - Abnormal; Notable for the following components:   Glucose-Capillary 106 (*)    All other components within normal limits  URINALYSIS, ROUTINE W REFLEX MICROSCOPIC  HEMOGLOBIN A1C    EKG EKG Interpretation  Date/Time:  Friday April 09 2019 07:28:06 EDT Ventricular Rate:  86 PR Interval:    QRS Duration: 108 QT Interval:  418 QTC Calculation: 500 R Axis:   6 Text Interpretation:  Sinus rhythm Borderline prolonged QT interval Confirmed by Pricilla LovelessGoldston, Derel Mcglasson [phone]([id number]) on 04/09/2019 7:36:32 AM   Radiology Dg Chest 2 View  Result Date: 04/09/2019 CLINICAL DATA:  Dizziness post aortic surgery 2 weeks ago. History of hypertension. EXAM: CHEST - 2 VIEW COMPARISON:  Radiographs 03/30/2019 and 03/27/2019. FINDINGS: The heart size and mediastinal contours are stable post median sternotomy. There is stable superior mediastinal widening. Small bilateral pleural effusions and bibasilar atelectasis have improved. There is no edema or pneumothorax. The bones appear unchanged. IMPRESSION: Interval improved bilateral  pleural effusions and bibasilar aeration. Otherwise stable postoperative chest without evidence of acute process. Electronically Signed   By: Carey BullocksWilliam  Veazey M.D.   On: 04/09/2019 08:54   Ct Head Wo Contrast  Result Date: 04/09/2019 CLINICAL DATA:  Ataxia, dizziness. EXAM: CT HEAD WITHOUT CONTRAST TECHNIQUE: Contiguous axial images were obtained from the base of the skull through the vertex without intravenous contrast. COMPARISON:  None. FINDINGS: Brain: No evidence of acute infarction, hemorrhage, hydrocephalus, extra-axial collection or mass lesion/mass effect. Vascular: No hyperdense vessel or unexpected calcification. Skull: Normal. Negative for fracture or focal lesion. Sinuses/Orbits: No acute finding. Other: None. IMPRESSION: Normal head CT. Electronically Signed   By: Lupita RaiderJames  Green Jr M.D.   On: 04/09/2019 10:23   Ct Angio Chest/abd/pel For Dissection W And/or Wo Contrast  Result Date: 04/09/2019 CLINICAL DATA:  Acute chest and back pain. EXAM: CT ANGIOGRAPHY CHEST, ABDOMEN AND PELVIS TECHNIQUE: Multidetector CT imaging through the chest, abdomen and pelvis was performed using the standard protocol during bolus administration of intravenous contrast. Multiplanar reconstructed images and MIPs were obtained and reviewed to evaluate the vascular anatomy. CONTRAST:  100mL OMNIPAQUE IOHEXOL 350 MG/ML SOLN COMPARISON:  CT scan of March 24, 2019. FINDINGS: CTA CHEST FINDINGS Cardiovascular: Status post surgical repair of ascending thoracic aortic dissection. Dissection flap is again noted to extend into the origin of the right innominate artery, which appears to be predominantly supplied by the true lumen. Evaluation of the left common carotid artery is severely limited due to scatter artifact arising from surgical hardware, although it appears to arise from true lumen. The dissection flap is also seen to extend into the origin of the left subclavian artery, which appears to be primarily supplied by the  true lumen. The dissection noted on prior exam persists within the transverse aortic arch and throughout the descending  thoracic aorta, with the true lumen being significantly smaller in caliber compared to prior exam. Mild cardiomegaly is noted. No pericardial effusion is noted. Mediastinum/Nodes: No enlarged mediastinal, hilar, or axillary lymph nodes. Thyroid gland, trachea, and esophagus demonstrate no significant findings. Lungs/Pleura: No pneumothorax is noted. Minimal right posterior basilar subsegmental atelectasis is noted. Left lower lobe subsegmental atelectasis is noted with small pleural effusion. Musculoskeletal: No chest wall abnormality. No acute or significant osseous findings. Review of the MIP images confirms the above findings. CTA ABDOMEN AND PELVIS FINDINGS VASCULAR Aorta: Dissection from thoracic aorta extends throughout the abdominal aorta to the bifurcation. Celiac: Widely patent without significant stenosis and appears to arise from true lumen. SMA: Widely patent without significant stenosis and appears to arise from the true lumen. Renals: Both renal arteries are widely patent without significant stenosis and appear to arise from the true lumen. IMA: Widely patent without stenosis and appears to arise from the true lumen. Inflow: The right common, internal and external iliac arteries are widely patent without dissection or stenosis. However, the dissection does appear to extend through the left common iliac artery and into the proximal left external iliac artery. Left internal iliac artery is widely patent and appears to arise from true lumen. Veins: No obvious venous abnormality within the limitations of this arterial phase study. Review of the MIP images confirms the above findings. NON-VASCULAR Hepatobiliary: No focal liver abnormality is seen. No gallstones, gallbladder wall thickening, or biliary dilatation. Pancreas: Unremarkable. No pancreatic ductal dilatation or surrounding  inflammatory changes. Spleen: Normal in size without focal abnormality. Adrenals/Urinary Tract: Adrenal glands appear normal. Left renal cyst is noted. No hydronephrosis or renal obstruction is noted. Small nonobstructive right renal calculus is noted. Urinary bladder is unremarkable. Stomach/Bowel: Stomach is within normal limits. Appendix appears normal. No evidence of bowel wall thickening, distention, or inflammatory changes. Lymphatic: No significant adenopathy is noted. Reproductive: Prostate is unremarkable. Other: Moderate size fat containing right inguinal hernia is noted. Musculoskeletal: No acute or significant osseous findings. Review of the MIP images confirms the above findings. IMPRESSION: Status post surgical repair of ascending thoracic aortic dissection noted on prior exam. There remains significant dissection involving the transverse aortic arch and descending thoracic aorta, which extends through the abdominal aorta and left common iliac artery and into the proximal portion of the left external iliac artery. Dissection flap is again noted to extend into the origins of the right innominate and left subclavian arteries, but these vessels do appear to be predominantly supplied by the true lumen. Left lower lobe atelectasis is noted with small adjacent pleural effusion. Minimal right posterior basilar subsegmental atelectasis. Small nonobstructive right renal calculus. No hydronephrosis or renal obstruction is noted. Moderate size fat containing right inguinal hernia is noted. Aortic Atherosclerosis (ICD10-I70.0). Electronically Signed   By: Lupita Raider M.D.   On: 04/09/2019 11:16    Procedures Procedures (including critical care time)  Medications Ordered in ED Medications  sodium chloride 0.9 % bolus 1,000 mL (0 mLs Intravenous Stopped 04/09/19 1035)  meclizine (ANTIVERT) tablet 25 mg (25 mg Oral Given 04/09/19 1034)  iohexol (OMNIPAQUE) 350 MG/ML injection 100 mL (100 mLs Intravenous  Contrast Given 04/09/19 0957)     Initial Impression / Assessment and Plan / ED Course  I have reviewed the triage vital signs and the nursing notes.  Pertinent labs & imaging results that were available during my care of the patient were reviewed by me and considered in my medical decision making (see chart  for details).        Patient's dizziness is of unclear etiology.  Unclear if it is presyncopal versus vertigo.  No focal neuro deficits or double vision.  He has a minimal headache when it occurs but no thunderclap or severe headache.  Not feeling a ton better after a little bit of fluid so he was given Antivert and CT head and chest/abdomen/pelvis were obtained to help rule out acute surgical complication.  Head CT benign.  CT of his aorta does not show any acute pathology.  He is feeling much better, unclear if it is the fluids are Antivert.  Given how suddenly it occurs and seems to be at rest, probably more vertiginous.  Doubt stroke given it comes and goes multiple times.  Will prescribe Antivert and have him follow-up with cardiology, who he missed in follow-up today due to coming to the ED.  It is also possible that being on 3 new blood pressure medicines is making him dizzier compared to what he was prior to his surgery.  Final Clinical Impressions(s) / ED Diagnoses   Final diagnoses:  Dizziness    ED Discharge Orders         Ordered    meclizine (ANTIVERT) 25 MG tablet  3 times daily PRN     04/09/19 1156           Pricilla LovelessGoldston, Irvin Bastin, MD 04/09/19 1209

## 2019-04-09 NOTE — ED Notes (Signed)
Patient verbalizes understanding of discharge instructions. Opportunity for questioning and answers were provided. Armband removed by staff, pt discharged from ED.  

## 2019-04-09 NOTE — ED Notes (Signed)
Patient transported to CT 

## 2019-04-09 NOTE — ED Triage Notes (Signed)
Pt here via EMS with C/O of intermittent dizziness since heart surgery approx 2 weeks ago that worsened this AM.   Pt denies any other symptoms.  EMS also noted bruising to LLQ. No meds given PTA.

## 2019-04-09 NOTE — ED Notes (Signed)
Patient transported to X-ray 

## 2019-04-09 NOTE — ED Notes (Signed)
ED Provider at bedside. 

## 2019-04-10 LAB — HEMOGLOBIN A1C
Hgb A1c MFr Bld: 5.6 % (ref 4.8–5.6)
Mean Plasma Glucose: 114 mg/dL

## 2019-04-13 ENCOUNTER — Telehealth: Payer: Self-pay | Admitting: Cardiology

## 2019-04-13 NOTE — Telephone Encounter (Signed)
New message   Patient's daughter in law is calling for lab results specifically the patient's a1c. Please call.

## 2019-04-13 NOTE — Telephone Encounter (Signed)
His A1c was 5.6.  This would say that he does not have diabetes.  Glenetta Hew, MD

## 2019-04-13 NOTE — Telephone Encounter (Signed)
Please advise- I know you didn't order these, they were done at hospital, but can you give an results for patient?  Thank you!!

## 2019-04-14 NOTE — Telephone Encounter (Signed)
Called and spoke to daughter in law per DPR- gave A1C reading.  No other questions at this time.

## 2019-04-23 ENCOUNTER — Other Ambulatory Visit: Payer: Self-pay

## 2019-04-23 ENCOUNTER — Encounter: Payer: Self-pay | Admitting: Thoracic Surgery (Cardiothoracic Vascular Surgery)

## 2019-04-23 ENCOUNTER — Ambulatory Visit (INDEPENDENT_AMBULATORY_CARE_PROVIDER_SITE_OTHER): Payer: Self-pay | Admitting: Thoracic Surgery (Cardiothoracic Vascular Surgery)

## 2019-04-23 ENCOUNTER — Ambulatory Visit
Admission: RE | Admit: 2019-04-23 | Discharge: 2019-04-23 | Disposition: A | Discharge status: Home / Self Care | Payer: Self-pay | Source: Ambulatory Visit | Attending: Thoracic Surgery (Cardiothoracic Vascular Surgery) | Admitting: Thoracic Surgery (Cardiothoracic Vascular Surgery)

## 2019-04-23 VITALS — BP 124/76 | HR 64 | Temp 97.9°F | Resp 20 | Ht 73.0 in | Wt 256.0 lb

## 2019-04-23 DIAGNOSIS — I7101 Dissection of thoracic aorta: Secondary | ICD-10-CM

## 2019-04-23 DIAGNOSIS — Z09 Encounter for follow-up examination after completed treatment for conditions other than malignant neoplasm: Secondary | ICD-10-CM

## 2019-04-23 DIAGNOSIS — I71019 Dissection of thoracic aorta, unspecified: Secondary | ICD-10-CM

## 2019-04-23 NOTE — Progress Notes (Signed)
      [address]       [place] [address]             [phone]        Mindy Hauter [facility] Medical Record #[id number] Date of Birth: [birth year]  Referring: Hayden Rasmussen, MD Primary Care: System, Provider Not In Primary Cardiologist:David Ellyn Hack, MD  Reason for visit:   follow-up  History of Present Illness:     He presents for his 1st follow-up after a type A dissection repair.  He was seen in the ED 2 weeks ago and was diagnosed with vertigo  Physical Exam: BP 124/76   Pulse 64   Temp 97.9 F (36.6 C) (Skin)   Resp 20   Ht 6\' 1"  (1.854 m)   Wt 256 lb (116.1 kg)   SpO2 93% Comment: RA  BMI 33.78 kg/m   Alert NAD RRR, no murmur.  Incision clean.  Sternum stable Abdomen soft, NT/ND trace peripheral edema   Diagnostic Studies & Laboratory data:     Assessment / Plan:   S/p type A dissection repair Clear for cardiac rehab Will continue annual surveillance.   Olla Delancey O Rachael Ferrie 04/23/2019 1:16 PM

## 2019-04-29 ENCOUNTER — Telehealth: Payer: Self-pay | Admitting: *Deleted

## 2019-04-29 NOTE — Telephone Encounter (Signed)
SPOKE TO DAUGHTER IN LAW . LIPID  RESULT GIVEN TO HER. SHE VERBALIZED UNDERSTANIDNG  SHE STATES PATIENT SAW [facility] [employer] AND HAS THE [facility] FOR CARDIAC REHAB.  SHE STATES THEY LIVE IN [place] THAN [facility] IN [place].  SHE STATES THAT PATIENT DOES NOT HAVE ANY INSURANCE - HE LOST HIS JOB 2 WEEKS AFTER  HOSPITALIZATION  SHE UNSURE WHAT TO DO ABOUT IS MEDICAL FINANCES.  RN INFORMED DAUGHTER IN LAW WILL REACH OUT TO  [facility] [employer] TO SEE IF SHE CAN BE ASSISTANCE.   DAUGHTER IN LAW IS ALSO WORRIED PATIENT IS  HAVING SOME VERTIGO ISSUE SINCE DISCHARGE . WILL SEEING A PRIMARY DOCTOR IN THE NEAR FUTURE _ (NEW PATIENT) RECOMMEND NOT TO DRIVE UNTIL APPOINTMENT

## 2019-05-11 ENCOUNTER — Other Ambulatory Visit: Payer: Self-pay

## 2019-05-12 ENCOUNTER — Encounter: Payer: Self-pay | Admitting: Family Medicine

## 2019-05-12 ENCOUNTER — Ambulatory Visit (INDEPENDENT_AMBULATORY_CARE_PROVIDER_SITE_OTHER): Payer: Self-pay | Admitting: Family Medicine

## 2019-05-12 VITALS — BP 128/69 | HR 57 | Temp 98.0°F | Resp 20 | Ht 73.0 in | Wt 268.0 lb

## 2019-05-12 DIAGNOSIS — T8141XA Infection following a procedure, superficial incisional surgical site, initial encounter: Secondary | ICD-10-CM

## 2019-05-12 DIAGNOSIS — I10 Essential (primary) hypertension: Secondary | ICD-10-CM

## 2019-05-12 DIAGNOSIS — D72828 Other elevated white blood cell count: Secondary | ICD-10-CM

## 2019-05-12 DIAGNOSIS — I71019 Dissection of thoracic aorta, unspecified: Secondary | ICD-10-CM

## 2019-05-12 DIAGNOSIS — R42 Dizziness and giddiness: Secondary | ICD-10-CM

## 2019-05-12 DIAGNOSIS — Z23 Encounter for immunization: Secondary | ICD-10-CM

## 2019-05-12 DIAGNOSIS — I7101 Dissection of thoracic aorta: Secondary | ICD-10-CM

## 2019-05-12 MED ORDER — ATORVASTATIN CALCIUM 40 MG PO TABS: 40.0000 mg | ORAL_TABLET | Freq: Every day | ORAL | 1 refills | Status: DC

## 2019-05-12 MED ORDER — MUPIROCIN 2 % EX OINT: 1.0000 "application " | TOPICAL_OINTMENT | Freq: Two times a day (BID) | CUTANEOUS | 0 refills | Status: DC

## 2019-05-12 MED ORDER — AMLODIPINE BESYLATE 10 MG PO TABS: 10.0000 mg | ORAL_TABLET | Freq: Every day | ORAL | 3 refills | Status: DC

## 2019-05-12 MED ORDER — METOPROLOL TARTRATE 100 MG PO TABS: 100.0000 mg | ORAL_TABLET | Freq: Two times a day (BID) | ORAL | 1 refills | Status: DC

## 2019-05-12 MED ORDER — LISINOPRIL 40 MG PO TABS: 40.0000 mg | ORAL_TABLET | Freq: Every day | ORAL | 1 refills | Status: DC

## 2019-05-12 NOTE — Patient Instructions (Signed)
DASH Eating Plan DASH stands for "Dietary Approaches to Stop Hypertension." The DASH eating plan is a healthy eating plan that has been shown to reduce high blood pressure (hypertension). Additional health benefits may include reducing the risk of type 2 diabetes mellitus, heart disease, and stroke. The DASH eating plan may also help with weight loss.  WHAT DO I NEED TO KNOW ABOUT THE DASH EATING PLAN? For the DASH eating plan, you will follow these general guidelines:  Choose foods with a percent daily value for sodium of less than 5% (as listed on the food label).  Use salt-free seasonings or herbs instead of table salt or sea salt.  Check with your health care provider or pharmacist before using salt substitutes.  Eat lower-sodium products, often labeled as "lower sodium" or "no salt added."  Eat fresh foods.  Eat more vegetables, fruits, and low-fat dairy products.  Choose whole grains. Look for the word "whole" as the first word in the ingredient list.  Choose fish and skinless chicken or turkey more often than red meat. Limit fish, poultry, and meat to 6 oz (170 g) each day.  Limit sweets, desserts, sugars, and sugary drinks.  Choose heart-healthy fats.  Limit cheese to 1 oz (28 g) per day.  Eat more home-cooked food and less restaurant, buffet, and fast food.  Limit fried foods.  Cook foods using methods other than frying.  Limit canned vegetables. If you do use them, rinse them well to decrease the sodium.  When eating at a restaurant, ask that your food be prepared with less salt, or no salt if possible.  WHAT FOODS CAN I EAT? Seek help from a dietitian for individual calorie needs.  Grains Whole grain or whole wheat bread. Brown rice. Whole grain or whole wheat pasta. Quinoa, bulgur, and whole grain cereals. Low-sodium cereals. Corn or whole wheat flour tortillas. Whole grain cornbread. Whole grain crackers. Low-sodium crackers.  Vegetables Fresh or frozen  vegetables (raw, steamed, roasted, or grilled). Low-sodium or reduced-sodium tomato and vegetable juices. Low-sodium or reduced-sodium tomato sauce and paste. Low-sodium or reduced-sodium canned vegetables.   Fruits All fresh, canned (in natural juice), or frozen fruits.  Meat and Other Protein Products Ground beef (85% or leaner), grass-fed beef, or beef trimmed of fat. Skinless chicken or turkey. Ground chicken or turkey. Pork trimmed of fat. All fish and seafood. Eggs. Dried beans, peas, or lentils. Unsalted nuts and seeds. Unsalted canned beans.  Dairy Low-fat dairy products, such as skim or 1% milk, 2% or reduced-fat cheeses, low-fat ricotta or cottage cheese, or plain low-fat yogurt. Low-sodium or reduced-sodium cheeses.  Fats and Oils Tub margarines without trans fats. Light or reduced-fat mayonnaise and salad dressings (reduced sodium). Avocado. Safflower, olive, or canola oils. Natural peanut or almond butter.  Other Unsalted popcorn and pretzels. The items listed above may not be a complete list of recommended foods or beverages. Contact your dietitian for more options.  WHAT FOODS ARE NOT RECOMMENDED?  Grains White bread. White pasta. White rice. Refined cornbread. Bagels and croissants. Crackers that contain trans fat.  Vegetables Creamed or fried vegetables. Vegetables in a cheese sauce. Regular canned vegetables. Regular canned tomato sauce and paste. Regular tomato and vegetable juices.  Fruits Dried fruits. Canned fruit in light or heavy syrup. Fruit juice.  Meat and Other Protein Products Fatty cuts of meat. Ribs, chicken wings, bacon, sausage, bologna, salami, chitterlings, fatback, hot dogs, bratwurst, and packaged luncheon meats. Salted nuts and seeds. Canned beans with salt.    Dairy Whole or 2% milk, cream, half-and-half, and cream cheese. Whole-fat or sweetened yogurt. Full-fat cheeses or blue cheese. Nondairy creamers and whipped toppings. Processed cheese,  cheese spreads, or cheese curds.  Condiments Onion and garlic salt, seasoned salt, table salt, and sea salt. Canned and packaged gravies. Worcestershire sauce. Tartar sauce. Barbecue sauce. Teriyaki sauce. Soy sauce, including reduced sodium. Steak sauce. Fish sauce. Oyster sauce. Cocktail sauce. Horseradish. Ketchup and mustard. Meat flavorings and tenderizers. Bouillon cubes. Hot sauce. Tabasco sauce. Marinades. Taco seasonings. Relishes.  Fats and Oils Butter, stick margarine, lard, shortening, ghee, and bacon fat. Coconut, palm kernel, or palm oils. Regular salad dressings.  Other Pickles and olives. Salted popcorn and pretzels.  The items listed above may not be a complete list of foods and beverages to avoid. Contact your dietitian for more information.  WHERE CAN I FIND MORE INFORMATION? National Heart, Lung, and [employer] Institute: www.nhlbi.nih.gov/health/health-topics/topics/dash/ Document Released: 06/27/2011 Document Revised: 11/22/2013 Document Reviewed: 05/12/2013 ExitCare Patient Information 2015 [employer]. This information is not intended to replace advice given to you by your health care provider. Make sure you discuss any questions you have with your health care provider.   I think that you would greatly benefit from seeing a nutritionist.  If you are interested, please call Dr Sykes at [phone] to schedule an appointment.   

## 2019-05-13 LAB — CBC WITH DIFFERENTIAL/PLATELET
Basophils Absolute: 0.1 10*3/uL (ref 0.0–0.2)
Basos: 1 %
EOS (ABSOLUTE): 0.5 10*3/uL — ABNORMAL HIGH (ref 0.0–0.4)
Eos: 7 %
Hematocrit: 43.4 % (ref 37.5–51.0)
Hemoglobin: 14.3 g/dL (ref 13.0–17.7)
Immature Grans (Abs): 0.1 10*3/uL (ref 0.0–0.1)
Immature Granulocytes: 1 %
Lymphocytes Absolute: 1.8 10*3/uL (ref 0.7–3.1)
Lymphs: 22 %
MCH: 28.3 pg (ref 26.6–33.0)
MCHC: 32.9 g/dL (ref 31.5–35.7)
MCV: 86 fL (ref 79–97)
Monocytes Absolute: 1.2 10*3/uL — ABNORMAL HIGH (ref 0.1–0.9)
Monocytes: 14 %
Neutrophils Absolute: 4.5 10*3/uL (ref 1.4–7.0)
Neutrophils: 55 %
Platelets: 290 10*3/uL (ref 150–450)
RBC: 5.05 x10E6/uL (ref 4.14–5.80)
RDW: 13.7 % (ref 11.6–15.4)
WBC: 8.1 10*3/uL (ref 3.4–10.8)

## 2019-05-13 LAB — CMP14+EGFR
ALT: 17 IU/L (ref 0–44)
AST: 13 IU/L (ref 0–40)
Albumin/Globulin Ratio: 1.7 (ref 1.2–2.2)
Albumin: 4 g/dL (ref 3.8–4.9)
Alkaline Phosphatase: 104 IU/L (ref 39–117)
BUN/Creatinine Ratio: 14 (ref 9–20)
BUN: 13 mg/dL (ref 6–24)
Bilirubin Total: 0.3 mg/dL (ref 0.0–1.2)
CO2: 24 mmol/L (ref 20–29)
Calcium: 9.5 mg/dL (ref 8.7–10.2)
Chloride: 102 mmol/L (ref 96–106)
Creatinine, Ser: 0.93 mg/dL (ref 0.76–1.27)
GFR calc Af Amer: 106 mL/min/{1.73_m2} (ref >59)
GFR calc non Af Amer: 91 mL/min/{1.73_m2} (ref >59)
Globulin, Total: 2.3 g/dL (ref 1.5–4.5)
Glucose: 94 mg/dL (ref 65–99)
Potassium: 3.8 mmol/L (ref 3.5–5.2)
Sodium: 142 mmol/L (ref 134–144)
Total Protein: 6.3 g/dL (ref 6.0–8.5)

## 2019-05-13 LAB — THYROID PANEL WITH TSH
Free Thyroxine Index: 1.6 (ref 1.2–4.9)
T3 Uptake Ratio: 24 % (ref 24–39)
T4, Total: 6.6 ug/dL (ref 4.5–12.0)
TSH: 1.16 u[IU]/mL (ref 0.450–4.500)

## 2019-05-13 NOTE — Progress Notes (Signed)
Subjective:  Patient ID: Jeffrey Flynn, male    DOB: 1961-05-07, 58 y.o.   MRN: [id number]  Patient Care Team: Baruch Gouty, FNP as PCP - General (Family Medicine) Leonie Man, MD as PCP - Cardiology (Cardiology)   Chief Complaint:  Establish Care (new - ([facility]) )   HPI: [name] is a 58 y.o. male presenting on 05/12/2019 for Establish Care (new - ([facility]) )  Pt presenting today to establish care. Pt was previously seen by Dr. Melina Copa. Pt states he has not seen her in over a year. Pt states on 03/24/2019 he developed dizziness, weakness, chest pain, and nausea. Pt was transported to the ED at [facility] where he has a CT scan that revealed a large dissection of the thoracic aorta that extended into the abdominal aorta and left common iliac artery. The pt was emergently transferred to [facility] ED for surgical repair. He was discharged home on 04/02/2019. He had follow up with Dr. Ellyn Hack on 04/05/2019 and was to have follow up with Dr. Kipp Brood on 04/09/2019 but had to be seen in the ED on this day due to ongoing dizziness. Pt states the dizziness with improving but he still gets dizzy when he stands too quickly. Pt states the cardiologist feels this is related to the initiation of BB's and better control of his BP as it was very poorly controlled prior to hospital admission. CT of head and chest were completed on 04/09/2019, no acute findings on head CT and no new findings on chest CT. Pt was discharged home with antivert as needed. Pt states he has not started cardiac rehab, is awaiting a schedule. Pt is taking his medications as prescribed. He denies chest pain, shortness of breath, fever, chills, fatigue, or leg swelling. No abdominal or back pain.    Relevant past medical, surgical, family, and social history reviewed and updated as indicated.  Allergies and medications reviewed and updated. Date reviewed: Chart in Epic.   Past Medical History:  Diagnosis Date    Cataract    Dysphagia    Status post endoscopic dilation   History of pneumothorax    Hypertension    Thoracic aortic dissection  - TYPE A 03/24/2019   s/p Open Repair - 28 mm Hemashield graft (Dr. Kipp Brood)    Past Surgical History:  Procedure Laterality Date   EYE SURGERY Bilateral    cataracts    THORACIC AORTIC ANEURYSM REPAIR N/A 03/24/2019   Procedure: THORACIC ASCENDING ANEURYSM REPAIR (AAA);  Surgeon: Lajuana Matte, MD;  Location: [place];  Service: Open Heart Surgery;  Laterality: N/A;   TRANSESOPHAGEAL ECHOCARDIOGRAM  03/24/2019   Intra-OP: EF 55-50%. No RWMA.  Non-concentric LVH.  Pre-surgical: Ascending Aorta noted dissection present w/ mobilization flap of Aortic Root, NOT involving Sinus of Valsalva. 70% of cross-sectional area of descending TAA. POST-REPAIR: NO AI (normal AO-Valve)    Social History   Socioeconomic History   Marital status: Legally Separated    Spouse name: Not on file   Number of children: 2   Years of education: Not on file   Highest education level: Not on file  Occupational History   Occupation: unemployed  Social Designer, fashion/clothing strain: Not on file   Food insecurity    Worry: Not on file    Inability: Not on file   Transportation needs    Medical: Not on file    Non-medical: Not on file  Tobacco Use  Smoking status: Former Smoker    Packs/day: 1.00    Years: 5.00    Pack years: 5.00    Types: Cigarettes, Cigars    Quit date: 03/26/2009    Years since quitting: 10.1   Smokeless tobacco: Never Used  Substance and Sexual Activity   Alcohol use: Never    Frequency: Never   Drug use: Never   Sexual activity: Not on file  Lifestyle   Physical activity    Days per week: Not on file    Minutes per session: Not on file   Stress: Not on file  Relationships   Social connections    Talks on phone: Not on file    Gets together: Not on file    Attends religious service: Not on file    Active  member of club or organization: Not on file    Attends meetings of clubs or organizations: Not on file    Relationship status: Not on file   Intimate partner violence    Fear of current or ex partner: Not on file    Emotionally abused: Not on file    Physically abused: Not on file    Forced sexual activity: Not on file  Other Topics Concern   Not on file  Social History Narrative   Lives with son and daughter in law and their 4 children     Outpatient Encounter Medications as of 05/12/2019  Medication Sig   acetaminophen (TYLENOL) 325 MG tablet Take 2 tablets (650 mg total) by mouth every 6 (six) hours as needed for mild pain, fever or headache.   amLODipine (NORVASC) 10 MG tablet Take 1 tablet (10 mg total) by mouth daily.   aspirin EC 325 MG EC tablet Take 1 tablet (325 mg total) by mouth daily.   atorvastatin (LIPITOR) 40 MG tablet Take 1 tablet (40 mg total) by mouth daily at 6 PM.   lisinopril (ZESTRIL) 40 MG tablet Take 1 tablet (40 mg total) by mouth daily.   meclizine (ANTIVERT) 25 MG tablet Take 1 tablet (25 mg total) by mouth 3 (three) times daily as needed for dizziness.   metoprolol tartrate (LOPRESSOR) 100 MG tablet Take 1 tablet (100 mg total) by mouth 2 (two) times daily.   [DISCONTINUED] amLODipine (NORVASC) 10 MG tablet Take 1 tablet (10 mg total) by mouth daily.   [DISCONTINUED] atorvastatin (LIPITOR) 40 MG tablet Take 1 tablet (40 mg total) by mouth daily at 6 PM.   [DISCONTINUED] lisinopril (ZESTRIL) 40 MG tablet Take 1 tablet (40 mg total) by mouth daily.   [DISCONTINUED] metoprolol tartrate (LOPRESSOR) 100 MG tablet Take 1 tablet (100 mg total) by mouth 2 (two) times daily.   mupirocin ointment (BACTROBAN) 2 % Place 1 application into the nose 2 (two) times daily.   oxyCODONE (OXY IR/ROXICODONE) 5 MG immediate release tablet Take 1 tablet (5 mg total) by mouth every 4 (four) hours as needed for severe pain. (Patient not taking: Reported on 05/12/2019)     No facility-administered encounter medications on file as of 05/12/2019.     No Known Allergies  Review of Systems  Constitutional: Negative for activity change, appetite change, chills, diaphoresis, fatigue, fever and unexpected weight change.  HENT: Negative.   Eyes: Negative.  Negative for photophobia and visual disturbance.  Respiratory: Positive for cough (baseline per pt). Negative for chest tightness and shortness of breath.   Cardiovascular: Negative for chest pain, palpitations and leg swelling.  Gastrointestinal: Negative for abdominal distention, abdominal pain,  blood in stool, constipation, diarrhea, nausea and vomiting.  Endocrine: Negative.   Genitourinary: Negative for decreased urine volume, difficulty urinating, dysuria, frequency and urgency.  Musculoskeletal: Negative for arthralgias, back pain and myalgias.  Skin: Negative.  Negative for color change and pallor.  Allergic/Immunologic: Negative.   Neurological: Positive for dizziness (with standing quickly). Negative for tremors, seizures, syncope, facial asymmetry, speech difficulty, weakness, light-headedness, numbness and headaches.  Hematological: Negative.  Does not bruise/bleed easily.  Psychiatric/Behavioral: Negative for confusion, hallucinations, sleep disturbance and suicidal ideas.  All other systems reviewed and are negative.       Objective:  BP 128/69    Pulse (!) 57    Temp 98 F (36.7 C)    Resp 20    Ht _0  (1.854 m)    Wt 268 lb (121.6 kg)    SpO2 97%    BMI 35.36 kg/m    Wt Readings from Last 3 Encounters:  05/12/19 268 lb (121.6 kg)  04/23/19 256 lb (116.1 kg)  04/09/19 257 lb (116.6 kg)    Physical Exam Vitals signs and nursing note reviewed.  Constitutional:      General: He is not in acute distress.    Appearance: Normal appearance. He is well-developed and well-groomed. He is morbidly obese. He is not ill-appearing, toxic-appearing or diaphoretic.  HENT:     Head:  Normocephalic and atraumatic.     Jaw: There is normal jaw occlusion.     Right Ear: Hearing normal.     Left Ear: Hearing normal.     Nose: Nose normal.     Mouth/Throat:     Lips: Pink.     Mouth: Mucous membranes are moist.     Pharynx: Oropharynx is clear. Uvula midline.  Eyes:     General: Lids are normal.     Extraocular Movements: Extraocular movements intact.     Conjunctiva/sclera: Conjunctivae normal.     Pupils: Pupils are equal, round, and reactive to light.  Neck:     Musculoskeletal: Normal range of motion and neck supple.     Thyroid: No thyroid mass, thyromegaly or thyroid tenderness.     Vascular: No carotid bruit or JVD.     Trachea: Trachea and phonation normal.  Cardiovascular:     Rate and Rhythm: Normal rate and regular rhythm.     Chest Wall: PMI is not displaced.     Pulses: Normal pulses.     Heart sounds: Murmur present. Systolic murmur present with a grade of 2/6. No friction rub. No gallop.   Pulmonary:     Effort: Pulmonary effort is normal. No respiratory distress.     Breath sounds: Normal breath sounds. No wheezing.  Chest:    Abdominal:     General: Bowel sounds are normal. There is no distension or abdominal bruit.     Palpations: Abdomen is soft. There is no hepatomegaly or splenomegaly.     Tenderness: There is no abdominal tenderness. There is no right CVA tenderness or left CVA tenderness.     Hernia: A hernia is present. Hernia is present in the right inguinal area.  Musculoskeletal: Normal range of motion.     Right lower leg: No edema.     Left lower leg: No edema.  Lymphadenopathy:     Cervical: No cervical adenopathy.  Skin:    General: Skin is warm and dry.     Capillary Refill: Capillary refill takes less than 2 seconds.     Coloration: Skin  is not cyanotic, jaundiced or pale.     Findings: No rash.  Neurological:     General: No focal deficit present.     Mental Status: He is alert and oriented to person, place, and time.      Cranial Nerves: Cranial nerves are intact. No cranial nerve deficit.     Sensory: Sensation is intact. No sensory deficit.     Motor: Motor function is intact. No weakness.     Coordination: Coordination is intact. Coordination normal.     Gait: Gait is intact. Gait normal.     Deep Tendon Reflexes: Reflexes are normal and symmetric. Reflexes normal.  Psychiatric:        Attention and Perception: Attention and perception normal.        Mood and Affect: Mood and affect normal.        Speech: Speech normal.        Behavior: Behavior normal. Behavior is cooperative.        Thought Content: Thought content normal.        Cognition and Memory: Cognition and memory normal.        Judgment: Judgment normal.     Results for orders placed or performed in visit on 05/12/19  CBC with Differential/Platelet  Result Value Ref Range   WBC 8.1 3.4 - 10.8 x10E3/uL   RBC 5.05 4.14 - 5.80 x10E6/uL   Hemoglobin 14.3 13.0 - 17.7 g/dL   Hematocrit 43.4 37.5 - 51.0 %   MCV 86 79 - 97 fL   MCH 28.3 26.6 - 33.0 pg   MCHC 32.9 31.5 - 35.7 g/dL   RDW 13.7 11.6 - 15.4 %   Platelets 290 150 - 450 x10E3/uL   Neutrophils 55 Not Estab. %   Lymphs 22 Not Estab. %   Monocytes 14 Not Estab. %   Eos 7 Not Estab. %   Basos 1 Not Estab. %   Neutrophils Absolute 4.5 1.4 - 7.0 x10E3/uL   Lymphocytes Absolute 1.8 0.7 - 3.1 x10E3/uL   Monocytes Absolute 1.2 (H) 0.1 - 0.9 x10E3/uL   EOS (ABSOLUTE) 0.5 (H) 0.0 - 0.4 x10E3/uL   Basophils Absolute 0.1 0.0 - 0.2 x10E3/uL   Immature Granulocytes 1 Not Estab. %   Immature Grans (Abs) 0.1 0.0 - 0.1 x10E3/uL  CMP14+EGFR  Result Value Ref Range   Glucose 94 65 - 99 mg/dL   BUN 13 6 - 24 mg/dL   Creatinine, Ser 0.93 0.76 - 1.27 mg/dL   GFR calc non Af Amer 91 >59 mL/min/1.73   GFR calc Af Amer 106 >59 mL/min/1.73   BUN/Creatinine Ratio 14 9 - 20   Sodium 142 134 - 144 mmol/L   Potassium 3.8 3.5 - 5.2 mmol/L   Chloride 102 96 - 106 mmol/L   CO2 24 20 - 29 mmol/L    Calcium 9.5 8.7 - 10.2 mg/dL   Total Protein 6.3 6.0 - 8.5 g/dL   Albumin 4.0 3.8 - 4.9 g/dL   Globulin, Total 2.3 1.5 - 4.5 g/dL   Albumin/Globulin Ratio 1.7 1.2 - 2.2   Bilirubin Total 0.3 0.0 - 1.2 mg/dL   Alkaline Phosphatase 104 39 - 117 IU/L   AST 13 0 - 40 IU/L   ALT 17 0 - 44 IU/L  Thyroid Panel With TSH  Result Value Ref Range   TSH 1.160 0.450 - 4.500 uIU/mL   T4, Total 6.6 4.5 - 12.0 ug/dL   T3 Uptake Ratio 24 24 - 39 %  Free Thyroxine Index 1.6 1.2 - 4.9       Pertinent labs & imaging results that were available during my care of the patient were reviewed by me and considered in my medical decision making.  Assessment & Plan:  Gustavus was seen today for establish care.  Diagnoses and all orders for this visit:  Essential hypertension BP well controlled. Changes were not made in regimen today. Goal BP is 130/80. Pt aware to report any persistent high or low readings. DASH diet and exercise encouraged. Exercise at least 150 minutes per week and increase as tolerated. Goal BMI > 25. Stress management encouraged. Avoid nicotine and tobacco product use. Avoid excessive alcohol and NSAID's. Avoid more than 2000 mg of sodium daily. Medications as prescribed. Follow up as scheduled.  -     CBC with Differential/Platelet -     CMP14+EGFR -     Thyroid Panel With TSH -     amLODipine (NORVASC) 10 MG tablet; Take 1 tablet (10 mg total) by mouth daily. -     lisinopril (ZESTRIL) 40 MG tablet; Take 1 tablet (40 mg total) by mouth daily. -     metoprolol tartrate (LOPRESSOR) 100 MG tablet; Take 1 tablet (100 mg total) by mouth 2 (two) times daily.  Vertigo Ongoing symptoms. Likely due to recent change in medications and CT surgery, was to attend cardiac rehab, has to schedule an appointment. Labs pending.  -     CBC with Differential/Platelet -     CMP14+EGFR -     Thyroid Panel With TSH  Dissection of thoracic aorta -- s/p Hemasheild repair, Normal Aortic Valve. Has follow up  scheduled with CT surgery and cardiology. Is awaiting cardiac rehab appointment.  -     atorvastatin (LIPITOR) 40 MG tablet; Take 1 tablet (40 mg total) by mouth daily at 6 PM. -     metoprolol tartrate (LOPRESSOR) 100 MG tablet; Take 1 tablet (100 mg total) by mouth 2 (two) times daily.  Other elevated white blood cell (WBC) count Last WBC 14 on 04/09/2019, will recheck today.  -     CBC with Differential/Platelet  Superficial incisional infection of surgical site Slight erythema with scabbing to sternal surgical scar. No fluctuance or purulent drainage. Wound care discussed in detail. Will treat with topical bactroban. Pt aware to report lack of improvement.  -     mupirocin ointment (BACTROBAN) 2 %; Place 1 application into the nose 2 (two) times daily.  Need for immunization against influenza -     Flu Vaccine QUAD 36+ mos IM     Continue all other maintenance medications.  Follow up plan: Return in about 3 months (around 08/12/2019), or if symptoms worsen or fail to improve, for HTN.  Continue healthy lifestyle choices, including diet (rich in fruits, vegetables, and lean proteins, and low in salt and simple carbohydrates) and exercise (at least 30 minutes of moderate physical activity daily).  Educational handout given for DASH diet  The above assessment and management plan was discussed with the patient. The patient verbalized understanding of and has agreed to the management plan. Patient is aware to call the clinic if they develop any new symptoms or if symptoms persist or worsen. Patient is aware when to return to the clinic for a follow-up visit. Patient educated on when it is appropriate to go to the emergency department.   Monia Pouch, FNP-C [facility] [phone]

## 2019-05-26 ENCOUNTER — Other Ambulatory Visit: Payer: Self-pay | Admitting: *Deleted

## 2019-05-26 DIAGNOSIS — I7101 Dissection of thoracic aorta: Secondary | ICD-10-CM

## 2019-05-26 DIAGNOSIS — I71019 Dissection of thoracic aorta, unspecified: Secondary | ICD-10-CM

## 2019-05-26 NOTE — Progress Notes (Signed)
Per verbal order Dr Ellyn Hack - referral to cardiac rehab phase ll

## 2019-05-28 ENCOUNTER — Telehealth (HOSPITAL_COMMUNITY): Payer: Self-pay | Admitting: *Deleted

## 2019-06-15 ENCOUNTER — Encounter: Payer: Self-pay | Admitting: Family Medicine

## 2019-06-15 ENCOUNTER — Ambulatory Visit (INDEPENDENT_AMBULATORY_CARE_PROVIDER_SITE_OTHER): Payer: Self-pay | Admitting: Family Medicine

## 2019-06-15 DIAGNOSIS — I10 Essential (primary) hypertension: Secondary | ICD-10-CM

## 2019-06-15 DIAGNOSIS — R42 Dizziness and giddiness: Secondary | ICD-10-CM

## 2019-06-15 NOTE — Progress Notes (Signed)
Virtual Visit via telephone Note Due to COVID-19 pandemic this visit was conducted virtually. This visit type was conducted due to national recommendations for restrictions regarding the COVID-19 Pandemic (e.g. social distancing, sheltering in place) in an effort to limit this patient's exposure and mitigate transmission in our community. All issues noted in this document were discussed and addressed.  A physical exam was not performed with this format.   I connected with Jeffrey Flynn on 06/15/2019 at 1245 by telephone and verified that I am speaking with the correct person using two identifiers. Jeffrey Flynn is currently located at home and family is currently with them during visit. The provider, Kari BaarsMichelle Jasiah Buntin, FNP is located in their office at time of visit.  I discussed the limitations, risks, security and privacy concerns of performing an evaluation and management service by telephone and the availability of in person appointments. I also discussed with the patient that there may be a patient responsible charge related to this service. The patient expressed understanding and agreed to proceed.  Subjective:  Patient ID: Jeffrey Flynn, male    DOB: 01-Dec-1960, 58 y.o.   MRN: [id number]  Chief Complaint:  Hypertension   HPI: Jeffrey Flynn is a 58 y.o. male presenting on 06/15/2019 for Hypertension   Pt reports this morning he had a quick spell of lightheadedness. States this only lasted a brief second. States he checked his blood pressure and it was 145/66. States his daughter called EMS and they checked his blood pressure and it was 156/85 and his EKG was normal. He reports he feels completely normal now. No other symptoms during the episode.     Relevant past medical, surgical, family, and social history reviewed and updated as indicated.  Allergies and medications reviewed and updated.   Past Medical History:  Diagnosis Date  . Cataract   . Dysphagia    Status post endoscopic dilation   . History of pneumothorax   . Hypertension   . Thoracic aortic dissection  - TYPE A 03/24/2019   s/p Open Repair - 28 mm Hemashield graft (Dr. Cliffton AstersLightfoot)    Past Surgical History:  Procedure Laterality Date  . EYE SURGERY Bilateral    cataracts   . THORACIC AORTIC ANEURYSM REPAIR N/A 03/24/2019   Procedure: THORACIC ASCENDING ANEURYSM REPAIR (AAA);  Surgeon: Corliss SkainsLightfoot, Harrell O, MD;  [facility];  Service: Open Heart Surgery;  Laterality: N/A;  . TRANSESOPHAGEAL ECHOCARDIOGRAM  03/24/2019   Intra-OP: EF 55-50%. No RWMA.  Non-concentric LVH.  Pre-surgical: Ascending Aorta noted dissection present w/ mobilization flap of Aortic Root, NOT involving Sinus of Valsalva. 70% of cross-sectional area of descending TAA. POST-REPAIR: NO AI (normal AO-Valve)    Social History   Socioeconomic History  . Marital status: Legally Separated    Spouse name: Not on file  . Number of children: 2  . Years of education: Not on file  . Highest education level: Not on file  Occupational History  . Occupation: unemployed  Social Needs  . Financial resource strain: Not on file  . Food insecurity    Worry: Not on file    Inability: Not on file  . Transportation needs    Medical: Not on file    Non-medical: Not on file  Tobacco Use  . Smoking status: Former Smoker    Packs/day: 1.00    Years: 5.00    Pack years: 5.00    Types: Cigarettes, Cigars    Quit date: 03/26/2009    Years since quitting:  10.2  . Smokeless tobacco: Never Used  Substance and Sexual Activity  . Alcohol use: Never    Frequency: Never  . Drug use: Never  . Sexual activity: Not on file  Lifestyle  . Physical activity    Days per week: Not on file    Minutes per session: Not on file  . Stress: Not on file  Relationships  . Social Musician on phone: Not on file    Gets together: Not on file    Attends religious service: Not on file    Active member of club or organization: Not on file    Attends meetings  of clubs or organizations: Not on file    Relationship status: Not on file  . Intimate partner violence    Fear of current or ex partner: Not on file    Emotionally abused: Not on file    Physically abused: Not on file    Forced sexual activity: Not on file  Other Topics Concern  . Not on file  Social History Narrative   Lives with son and daughter in law and their 4 children     Outpatient Encounter Medications as of 06/15/2019  Medication Sig  . acetaminophen (TYLENOL) 325 MG tablet Take 2 tablets (650 mg total) by mouth every 6 (six) hours as needed for mild pain, fever or headache.  [name] amLODipine (NORVASC) 10 MG tablet Take 1 tablet (10 mg total) by mouth daily.  [name] aspirin EC 325 MG EC tablet Take 1 tablet (325 mg total) by mouth daily.  [name] atorvastatin (LIPITOR) 40 MG tablet Take 1 tablet (40 mg total) by mouth daily at 6 PM.  . lisinopril (ZESTRIL) 40 MG tablet Take 1 tablet (40 mg total) by mouth daily.  . meclizine (ANTIVERT) 25 MG tablet Take 1 tablet (25 mg total) by mouth 3 (three) times daily as needed for dizziness.  . metoprolol tartrate (LOPRESSOR) 100 MG tablet Take 1 tablet (100 mg total) by mouth 2 (two) times daily.  . mupirocin ointment (BACTROBAN) 2 % Place 1 application into the nose 2 (two) times daily.  [name] oxyCODONE (OXY IR/ROXICODONE) 5 MG immediate release tablet Take 1 tablet (5 mg total) by mouth every 4 (four) hours as needed for severe pain. (Patient not taking: Reported on 05/12/2019)   No facility-administered encounter medications on file as of 06/15/2019.     No Known Allergies  Review of Systems  Constitutional: Negative for activity change, appetite change, chills, diaphoresis, fatigue, fever and unexpected weight change.  HENT: Negative.   Eyes: Negative.  Negative for photophobia and visual disturbance.  Respiratory: Negative for cough, chest tightness and shortness of breath.   Cardiovascular: Negative for chest pain, palpitations and leg  swelling.  Gastrointestinal: Negative for blood in stool, constipation, diarrhea, nausea and vomiting.  Endocrine: Negative.   Genitourinary: Negative for decreased urine volume, difficulty urinating, dysuria, frequency and urgency.  Musculoskeletal: Negative for arthralgias and myalgias.  Skin: Negative.   Allergic/Immunologic: Negative.   Neurological: Positive for light-headedness. Negative for dizziness, tremors, seizures, syncope, facial asymmetry, speech difficulty, weakness, numbness and headaches.  Hematological: Negative.   Psychiatric/Behavioral: Negative for confusion, hallucinations, sleep disturbance and suicidal ideas.  All other systems reviewed and are negative.        Observations/Objective: No vital signs or physical exam, this was a telephone or virtual health encounter.  Pt alert and oriented, answers all questions appropriately, and able to speak in full sentences.  Assessment and Plan: Hurley was seen today for hypertension.  Diagnoses and all orders for this visit:  Essential hypertension Light headedness Episode of light headedness this morning. Symptoms have completely resolved. Pt aware to report any return of symptoms or new or worsening symptoms. Pt aware of symptoms that require emergent evaluation and treatment.     Follow Up Instructions: Return if symptoms worsen or fail to improve.    I discussed the assessment and treatment plan with the patient. The patient was provided an opportunity to ask questions and all were answered. The patient agreed with the plan and demonstrated an understanding of the instructions.   The patient was advised to call back or seek an in-person evaluation if the symptoms worsen or if the condition fails to improve as anticipated.  The above assessment and management plan was discussed with the patient. The patient verbalized understanding of and has agreed to the management plan. Patient is aware to call the clinic  if they develop any new symptoms or if symptoms persist or worsen. Patient is aware when to return to the clinic for a follow-up visit. Patient educated on when it is appropriate to go to the emergency department.    I provided 15 minutes of non-face-to-face time during this encounter. The call started at 1245. The call ended at 1300. The other time was used for coordination of care.    Monia Pouch, FNP-C [facility] [address] [place], [place] [address] [phone] 06/15/2019

## 2019-07-05 ENCOUNTER — Encounter: Payer: Self-pay | Admitting: Cardiology

## 2019-07-05 ENCOUNTER — Ambulatory Visit (INDEPENDENT_AMBULATORY_CARE_PROVIDER_SITE_OTHER): Payer: Self-pay | Admitting: Cardiology

## 2019-07-05 ENCOUNTER — Other Ambulatory Visit: Payer: Self-pay

## 2019-07-05 VITALS — BP 141/73 | HR 55 | Temp 97.9°F | Ht 73.0 in | Wt 286.0 lb

## 2019-07-05 DIAGNOSIS — I71019 Dissection of thoracic aorta, unspecified: Secondary | ICD-10-CM

## 2019-07-05 DIAGNOSIS — R011 Cardiac murmur, unspecified: Secondary | ICD-10-CM | POA: Insufficient documentation

## 2019-07-05 DIAGNOSIS — R0989 Other specified symptoms and signs involving the circulatory and respiratory systems: Secondary | ICD-10-CM | POA: Insufficient documentation

## 2019-07-05 DIAGNOSIS — I7101 Dissection of thoracic aorta: Secondary | ICD-10-CM

## 2019-07-05 DIAGNOSIS — I10 Essential (primary) hypertension: Secondary | ICD-10-CM

## 2019-07-05 DIAGNOSIS — E785 Hyperlipidemia, unspecified: Secondary | ICD-10-CM

## 2019-07-05 NOTE — Patient Instructions (Addendum)
Medication Instructions:  No changes  *If you need a refill on your cardiac medications before your next appointment, please call your pharmacy*  Lab Work:  please have labs one March 2021  cmp Lipids  If you have labs (blood work) drawn today and your tests are completely normal, you will receive your results only by: [name] MyChart Message (if you have MyChart) OR . A paper copy in the mail If you have any lab test that is abnormal or we need to change your treatment, we will call you to review the results.  Testing/Procedures: Will be schedule in March 2021 at [place] has requested that you have an echocardiogram. Echocardiography is a painless test that uses sound waves to create images of your heart. It provides your doctor with information about the size and shape of your heart and how well your heart's chambers and valves are working. This procedure takes approximately one hour. There are no restrictions for this procedure.  and Will be schedule in March 2021 at [facility] has requested that you have a carotid duplex. This test is an ultrasound of the carotid arteries in your neck. It looks at blood flow through these arteries that supply the brain with blood. Allow one hour for this exam. There are no restrictions or special instructions.   OTHER INSTRUCTIONS   FROM A CARDIAC STANDPOINT YOU MAY RETURN BACK TO WORK  OR LOOK FOR A JOB  Follow-Up: At [facility], you and your health needs are our priority.  As part of our continuing mission to provide you with exceptional heart care, we have created designated Provider Care Teams.  These Care Teams include your primary Cardiologist (physician) and Advanced Practice Providers (APPs -  Physician Assistants and Nurse Practitioners) who all work together to provide you with the care you need, when you need it.  Your next appointment:   4 to 6 month(s)  The  format for your next appointment:   In Person  Provider:   Glenetta Hew, MD

## 2019-07-05 NOTE — Progress Notes (Signed)
Primary Care Provider: Sonny Masters, FNP Cardiologist: Bryan Lemma, MD CVTS: Dr. Cliffton Asters   Clinic Note: Chief Complaint  Patient presents with  . Follow-up    3 months    HPI:    Jeffrey Flynn is a 58 y.o. 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  Jeffrey Flynn was last seen on 04/05/2019.  He had not yet seen surgery.  He was gradually building exercise.  Was still walking with a walker with a slow unsteady gait.  Epworth score was 14  Increased amlodipine to 10 mg.  Check lipids -> 1 month follow-up in CV[facility] for BP and lipids.  Recent Hospitalizations:   April 09, 2019, went to the ER for dizziness which was the fourth episode since his aortic dissection surgery.  Was lying in bed watching TV and felt acutely dizzy felt the world spinning.  The episode lasted 30 minutes.  Then he had a second spell outside of the hospital 2 days later.  Symptoms sounded pretty vagal in nature.  Relatively benign evaluation in the ER.  Reviewed  CV studies:    The following studies were reviewed today: (if available, images/films reviewed: From Epic Chart or Care Everywhere) . No new studies   Interval History:   Jeffrey Flynn returns here today overall doing fairly well.  He does recount the episode with sound like vertigo.  The most recent episode he had in the last more than a few minutes.  He said it can happen when he standing up or laying down no real association with nothing in particular.  Not necessarily orthostatic in nature. He has off and on pulling and twisting type sensations in his chest that can be out of the blue.  Not associate with any particular activity.  Mostly centered around the surgical scar.  He feels like it feels tight sometimes in his skin.  He tells about off-and-on lightheadedness and dizziness where he was told by EMS he had somewhat low blood pressures.   There was thought it may be some orthostatic symptoms. Really he is not having any exertional chest pain pressure or dyspnea but he just notes that because he has not really been doing much and is gradually building strength back and is somewhat deconditioned.  He denies any PND, orthopnea or edema.  He gets tired and little short of breath when he exerts but not out of the realm of what he expects.  He is better each week.   CV Review of Symptoms (Summary): positive for - Mild exertional dyspnea, musculoskeletal chest pain, lightheadedness and vertiginous dizziness. negative for - edema, irregular heartbeat, orthopnea, palpitations, paroxysmal nocturnal dyspnea, rapid heart rate, shortness of breath or Syncope with dizziness but not near syncope.  TIA/amaurosis fugax.  Claudication.  The patient does not have symptoms concerning for COVID-19 infection (fever, chills, cough, or new shortness of breath).  The patient is practicing social distancing.  He is consistent with his masking.  Occasionally he will go out for groceries/shopping.  Not yet back to work-was waiting to hear from me.   REVIEWED OF SYSTEMS   A comprehensive ROS was performed. Review of Systems  Constitutional: Negative for malaise/fatigue (Energy level getting better).  HENT: Negative for congestion and nosebleeds.   Eyes:       No longer nursing the blurry or double vision.  Respiratory: Positive for shortness of breath (Not at  rest). Negative for cough (Mild baseline) and wheezing.   Cardiovascular: Positive for chest pain (More chest wall pain) and leg swelling (Mild swelling at the end the day, but not as bad since he is not yet back at work.).  Gastrointestinal: Negative for blood in stool, heartburn and melena.  Genitourinary: Positive for flank pain. Negative for hematuria.  Musculoskeletal: Negative for falls.  Neurological: Positive for dizziness (Per HPI).  Psychiatric/Behavioral: Negative.    I have reviewed  and (if needed) personally updated the patient's problem list, medications, allergies, past medical and surgical history, social and family history.   PAST MEDICAL HISTORY   Past Medical History:  Diagnosis Date  . Cataract   . Dysphagia    Status post endoscopic dilation  . History of pneumothorax   . Hypertension   . Thoracic aortic dissection  - TYPE A 03/24/2019   s/p Open Repair - 28 mm Hemashield graft (Dr. Cliffton AstersLightfoot)     PAST SURGICAL HISTORY   Past Surgical History:  Procedure Laterality Date  . EYE SURGERY Bilateral    cataracts   . THORACIC AORTIC ANEURYSM REPAIR N/A 03/24/2019   Procedure: THORACIC ASCENDING ANEURYSM REPAIR (AAA);  Surgeon: Corliss SkainsLightfoot, Harrell O, MD;  [facility];  Service: Open Heart Surgery;  Laterality: N/A;  . TRANSESOPHAGEAL ECHOCARDIOGRAM  03/24/2019   Intra-OP: EF 55-50%. No RWMA.  Non-concentric LVH.  Pre-surgical: Ascending Aorta noted dissection present w/ mobilization flap of Aortic Root, NOT involving Sinus of Valsalva. 70% of cross-sectional area of descending TAA. POST-REPAIR: NO AI (normal AO-Valve)     MEDICATIONS/ALLERGIES   Current Meds  Medication Sig  . acetaminophen (TYLENOL) 325 MG tablet Take 2 tablets (650 mg total) by mouth every 6 (six) hours as needed for mild pain, fever or headache.  [name]. amLODipine (NORVASC) 10 MG tablet Take 1 tablet (10 mg total) by mouth daily.  [name]. aspirin EC 325 MG EC tablet Take 1 tablet (325 mg total) by mouth daily.  [name]. atorvastatin (LIPITOR) 40 MG tablet Take 1 tablet (40 mg total) by mouth daily at 6 PM.  . lisinopril (ZESTRIL) 40 MG tablet Take 1 tablet (40 mg total) by mouth daily.  . meclizine (ANTIVERT) 25 MG tablet Take 1 tablet (25 mg total) by mouth 3 (three) times daily as needed for dizziness.  . metoprolol tartrate (LOPRESSOR) 100 MG tablet Take 1 tablet (100 mg total) by mouth 2 (two) times daily.  . mupirocin ointment (BACTROBAN) 2 % Place 1 application into the nose 2 (two) times  daily.  [name]. oxyCODONE (OXY IR/ROXICODONE) 5 MG immediate release tablet Take 1 tablet (5 mg total) by mouth every 4 (four) hours as needed for severe pain.    No Known Allergies   SOCIAL HISTORY/FAMILY HISTORY   Social History   Tobacco Use  . Smoking status: Former Smoker    Packs/day: 1.00    Years: 5.00    Pack years: 5.00    Types: Cigarettes, Cigars    Quit date: 03/26/2009    Years since quitting: 10.2  . Smokeless tobacco: Never Used  Substance Use Topics  . Alcohol use: Never  . Drug use: Never   Social History   Social History Narrative   Lives with son and daughter in law and their 4 children     Family History family history includes Bone cancer in his father; Cancer in his brother and brother; Diabetes in his son; Diabetes type II in his paternal grandfather; Heart attack in his maternal  grandmother and mother; Hernia in his sister; Hypertension in his brother, brother, mother, sister, sister, sister, and sister; Neuropathy in his brother; Ovarian cancer in his sister; Prostate cancer in his maternal grandfather; Stroke in his brother.   OBJCTIVE -PE, EKG, labs   Wt Readings from Last 3 Encounters:  07/05/19 286 lb (129.7 kg)  05/12/19 268 lb (121.6 kg)  04/23/19 256 lb (116.1 kg)    Physical Exam: BP (!) 141/73   Pulse (!) 55   Temp 97.9 F (36.6 C)   Ht 6\' 1"  (1.854 m)   Wt 286 lb (129.7 kg)   SpO2 97%   BMI 37.73 kg/m  Physical Exam  Constitutional: He is oriented to person, place, and time. He appears well-developed and well-nourished. No distress.  Healthy-appearing.  Well-groomed  HENT:  Head: Normocephalic and atraumatic.  Neck: No hepatojugular reflux and no JVD present. Carotid bruit is present (Cannot exclude radiated aortic murmur.).  Cardiovascular: Normal rate and regular rhythm.  Occasional extrasystoles are present. PMI is not displaced. Exam reveals no gallop and no friction rub.  Murmur (2/6C-D SEM at RUSB.  Radiates to neck.)  heard. Pulmonary/Chest: Effort normal and breath sounds normal. No respiratory distress. He has no wheezes. He has no rales.  Abdominal: Soft. Bowel sounds are normal. He exhibits no distension. There is no abdominal tenderness. There is no rebound.  Musculoskeletal:        General: Edema (Trivial) present.     Cervical back: Normal range of motion and neck supple.  Neurological: He is alert and oriented to person, place, and time.  Psychiatric: He has a normal mood and affect. His behavior is normal. Judgment and thought content normal.  Vitals reviewed.   Adult ECG Report N/a  Recent Labs:    Lab Results  Component Value Date   CHOL 79 (L) 04/05/2019   HDL 25 (L) 04/05/2019   LDLCALC 37 04/05/2019   TRIG 79 04/05/2019   CHOLHDL 3.2 04/05/2019   Lab Results  Component Value Date   CREATININE 0.93 05/12/2019   BUN 13 05/12/2019   NA 142 05/12/2019   K 3.8 05/12/2019   CL 102 05/12/2019   CO2 24 05/12/2019    ASSESSMENT/PLAN    Problem List Items Addressed This Visit    Dissection of thoracic aorta -- s/p Hemasheild repair, Normal Aortic Valve. (Chronic)    Status post repair.  Stable postop now.  From a cardiac standpoint should be okay to go back to work.  Defer musculoskeletal issues to surgery.  We will recheck 2D echo to assess aortic valve and get a new baseline for the aortic root-because I now hear a murmur..  Need to continue to monitor and treat blood pressure.  Dr. Kipp Brood was a little bit concerned about too aggressive management with lower diastolic pressures and some who just had aortic dissection repair.  Continue statin for atherosclerotic disease.  LDL is well controlled.  I think he is probably okay to reduce back to 81 mg aspirin.      Relevant Orders   Lipid panel   Comprehensive metabolic panel   ECHOCARDIOGRAM COMPLETE   Essential hypertension - Primary (Chronic)    Blood pressure is a little better today.  I am little reluctant of adjusting  his meds anymore because he is having these dizzy spells.  I had thought about potential of reducing the lisinopril dose to 20 and adding HCTZ since he has a little edema.  However if there is concern  about orthostasis, would not want to dehydrate him.  We will continue to monitor and see how he does the more active he is.      Relevant Orders   Lipid panel   Comprehensive metabolic panel   Morbid obesity (HCC) (Chronic)    Unfortunately, he is put on quite a bit of weight since his surgery because he has not been as active.  Hopefully once he starts to get back into an activity regimen, he will be losing the weight back but he is 30 pounds up from October.  Needs to watch his diet and start getting into exercise now that he has been cleared by surgery      Relevant Orders   Lipid panel   Comprehensive metabolic panel   Dyslipidemia, goal LDL below 70 (Chronic)    Lipids look great at the time of his surgery, but needs to be rechecked.  Will check lipid panel now and then back in March.  He is on a stable dose of statin which we will continue for now.      Systolic ejection murmur    Not recall hearing a murmur as prominent last visit.  Could this could be related to sewing ring, but need to exclude potential injury of the valve.  Plan   2D echo.  Also with having aortic dissection and hearing what could either be a bruit versus radiated aortic murmur, will also check carotid Dopplers.      Relevant Orders   ECHOCARDIOGRAM COMPLETE   Carotid bruit    Could be radiated aortic murmur versus true carotid disease.  With clear evidence of aortic disease, need to exclude additional vascular disease. Plan: Check carotid Dopplers.      Relevant Orders   Lipid panel   VAS US CAROTID       COVID-19 Education: The signs and symptoms of COVID-19 were discussed with the patient and how to seek care for testing (follow up with PCP or arrange E-visit).   The importance of social  distancing was discussed today.  I spent a total of with the patient and chart review. >  50% of the time was spent in direct patient consultation.  Additional time spent with chart review (studies, outside notes, etc): 8 Total Time: 24 min   Current medicines are reviewed at length with the patient today.  (+/- concerns) n/a   Patient Instructions / Medication Changes & Studies & Tests Ordered   Patient Instructions  Medication Instructions:  No changes  *If you need a refill on your cardiac medications before your next appointment, please call your pharmacy*  Lab Work:  please have labs one March 2021  cmp Lipids  If you have labs (blood work) drawn today and your tests are completely normal, you will receive your results only by: [name] MyChart Message (if you have MyChart) OR . A paper copy in the mail If you have any lab test that is abnormal or we need to change your treatment, we will call you to review the results.  Testing/Procedures: Will be schedule in March 2021 at [facility] [address] Your physician has requested that you have an echocardiogram. Echocardiography is a painless test that uses sound waves to create images of your heart. It provides your doctor with information about the size and shape of your heart and how well your heart's chambers and valves are working. This procedure takes approximately one hour. There are no restrictions for this procedure.  and Will be schedule in March 2021 at [address]  Your physician has requested that you have a carotid duplex. This test is an ultrasound of the carotid arteries in your neck. It looks at blood flow through these arteries that supply the brain with blood. Allow one hour for this exam. There are no restrictions or special instructions.   OTHER INSTRUCTIONS   FROM A CARDIAC STANDPOINT YOU MAY RETURN BACK TO WORK  OR LOOK FOR A JOB  Follow-Up: At [facility], you and  your health needs are our priority.  As part of our continuing mission to provide you with exceptional heart care, we have created designated Provider Care Teams.  These Care Teams include your primary Cardiologist (physician) and Advanced Practice Providers (APPs -  Physician Assistants and Nurse Practitioners) who all work together to provide you with the care you need, when you need it.  Your next appointment:   4 to 6 month(s)  The format for your next appointment:   In Person  Provider:   Bryan Lemma, MD      Studies Ordered:   Orders Placed This Encounter  Procedures  . Lipid panel  . Comprehensive metabolic panel  . ECHOCARDIOGRAM COMPLETE  . VAS US CAROTID     Bryan Lemma, M.D., M.S. Interventional Cardiologist   Pager # [phone] Phone # [phone] [address] [place], [place] [address]   Thank you for choosing Heartcare at [facility]!!

## 2019-07-07 ENCOUNTER — Encounter: Payer: Self-pay | Admitting: Cardiology

## 2019-07-07 DIAGNOSIS — E785 Hyperlipidemia, unspecified: Secondary | ICD-10-CM | POA: Insufficient documentation

## 2019-07-07 NOTE — Assessment & Plan Note (Addendum)
Status post repair.  Stable postop now.  From a cardiac standpoint should be okay to go back to work.  Defer musculoskeletal issues to surgery.  We will recheck 2D echo to assess aortic valve and get a new baseline for the aortic root-because I now hear a murmur..  Need to continue to monitor and treat blood pressure.  Dr. Kipp Brood was a little bit concerned about too aggressive management with lower diastolic pressures and some who just had aortic dissection repair.  Continue statin for atherosclerotic disease.  LDL is well controlled.  I think he is probably okay to reduce back to 81 mg aspirin.

## 2019-07-07 NOTE — Assessment & Plan Note (Signed)
Could be radiated aortic murmur versus true carotid disease.  With clear evidence of aortic disease, need to exclude additional vascular disease. Plan: Check carotid Dopplers.

## 2019-07-07 NOTE — Assessment & Plan Note (Signed)
Lipids look great at the time of his surgery, but needs to be rechecked.  Will check lipid panel now and then back in March.  He is on a stable dose of statin which we will continue for now.

## 2019-07-07 NOTE — Assessment & Plan Note (Signed)
Not recall hearing a murmur as prominent last visit.  Could this could be related to sewing ring, but need to exclude potential injury of the valve.  Plan   2D echo.  Also with having aortic dissection and hearing what could either be a bruit versus radiated aortic murmur, will also check carotid Dopplers.

## 2019-07-07 NOTE — Assessment & Plan Note (Signed)
Blood pressure is a little better today.  I am little reluctant of adjusting his meds anymore because he is having these dizzy spells.  I had thought about potential of reducing the lisinopril dose to 20 and adding HCTZ since he has a little edema.  However if there is concern about orthostasis, would not want to dehydrate him.  We will continue to monitor and see how he does the more active he is.

## 2019-07-07 NOTE — Assessment & Plan Note (Signed)
Unfortunately, he is put on quite a bit of weight since his surgery because he has not been as active.  Hopefully once he starts to get back into an activity regimen, he will be losing the weight back but he is 30 pounds up from October.  Needs to watch his diet and start getting into exercise now that he has been cleared by surgery

## 2019-07-29 ENCOUNTER — Other Ambulatory Visit: Payer: Self-pay | Admitting: Family Medicine

## 2019-07-29 DIAGNOSIS — I71019 Dissection of thoracic aorta, unspecified: Secondary | ICD-10-CM

## 2019-07-29 DIAGNOSIS — I7101 Dissection of thoracic aorta: Secondary | ICD-10-CM

## 2019-07-29 DIAGNOSIS — I10 Essential (primary) hypertension: Secondary | ICD-10-CM

## 2019-08-10 ENCOUNTER — Ambulatory Visit (INDEPENDENT_AMBULATORY_CARE_PROVIDER_SITE_OTHER): Payer: Self-pay | Admitting: Family Medicine

## 2019-08-10 ENCOUNTER — Encounter: Payer: Self-pay | Admitting: Family Medicine

## 2019-08-10 ENCOUNTER — Other Ambulatory Visit: Payer: Self-pay

## 2019-08-10 VITALS — BP 116/67 | HR 58 | Temp 98.0°F | Resp 20 | Ht 73.0 in | Wt 297.0 lb

## 2019-08-10 DIAGNOSIS — R0989 Other specified symptoms and signs involving the circulatory and respiratory systems: Secondary | ICD-10-CM

## 2019-08-10 DIAGNOSIS — I7101 Dissection of thoracic aorta: Secondary | ICD-10-CM

## 2019-08-10 DIAGNOSIS — E785 Hyperlipidemia, unspecified: Secondary | ICD-10-CM

## 2019-08-10 DIAGNOSIS — I10 Essential (primary) hypertension: Secondary | ICD-10-CM

## 2019-08-10 DIAGNOSIS — I71019 Dissection of thoracic aorta, unspecified: Secondary | ICD-10-CM

## 2019-08-10 MED ORDER — LISINOPRIL 40 MG PO TABS: 40.0000 mg | ORAL_TABLET | Freq: Every day | ORAL | 11 refills | Status: DC

## 2019-08-10 MED ORDER — ATORVASTATIN CALCIUM 40 MG PO TABS: ORAL_TABLET | ORAL | 11 refills | Status: DC

## 2019-08-10 MED ORDER — METOPROLOL TARTRATE 100 MG PO TABS: 100.0000 mg | ORAL_TABLET | Freq: Two times a day (BID) | ORAL | 11 refills | Status: DC

## 2019-08-10 MED ORDER — AMLODIPINE BESYLATE 10 MG PO TABS: 10.0000 mg | ORAL_TABLET | Freq: Every day | ORAL | 11 refills | Status: DC

## 2019-08-10 NOTE — Progress Notes (Signed)
Subjective:  Patient ID: Jeffrey Flynn, male    DOB: 1961/01/04, 59 y.o.   MRN: [id number]  Patient Care Team: Baruch Gouty, FNP as PCP - General (Family Medicine) Leonie Man, MD as PCP - Cardiology (Cardiology)   Chief Complaint:  Medical Management of Chronic Issues (3 mo ), Hyperlipidemia, and Hypertension   HPI: Jeffrey Flynn is a 59 y.o. male presenting on 08/10/2019 for Medical Management of Chronic Issues (3 mo ), Hyperlipidemia, and Hypertension   1. Essential hypertension Complaint with meds - Yes Current Medications - Norvasc, lisinopril, metoprolol  Checking BP at home - No Exercising Regularly - No Watching Salt intake - No Pertinent ROS:  Headache - No Fatigue - No Visual Disturbances - Yes Chest pain - No Dyspnea - Yes Palpitations - No LE edema - Yes They report good compliance with medications and can restate their regimen by memory. No medication side effects.  Family, social, and smoking history reviewed.   BP Readings from Last 3 Encounters:  08/10/19 116/67  07/05/19 (!) 141/73  05/12/19 128/69   CMP Latest Ref Rng & Units 05/12/2019 04/09/2019 04/05/2019  Glucose 65 - 99 mg/dL 94 110(H) 102(H)  BUN 6 - 24 mg/dL '13 15 16  ' Creatinine 0.76 - 1.27 mg/dL 0.93 0.92 0.78  Sodium 134 - 144 mmol/L 142 138 137  Potassium 3.5 - 5.2 mmol/L 3.8 3.8 4.6  Chloride 96 - 106 mmol/L 102 101 96  CO2 20 - 29 mmol/L '24 26 25  ' Calcium 8.7 - 10.2 mg/dL 9.5 8.9 8.8  Total Protein 6.0 - 8.5 g/dL 6.3 6.4(L) 6.3  Total Bilirubin 0.0 - 1.2 mg/dL 0.3 0.4 0.4  Alkaline Phos 39 - 117 IU/L 104 93 100  AST 0 - 40 IU/L '13 20 22  ' ALT 0 - 44 IU/L '17 22 29      ' 2. Dyslipidemia, goal LDL below 70 Compliant with medications - Yes Current medications - Lipitor Side effects from medications - No Diet - does not watch Exercise - none  Lab Results  Component Value Date   CHOL 79 (L) 04/05/2019   HDL 25 (L) 04/05/2019   LDLCALC 37 04/05/2019   TRIG 79 04/05/2019   CHOLHDL 3.2 04/05/2019     Family and personal medical history reviewed. Smoking and ETOH history reviewed.    3. Morbid obesity ([place]) Does not diet and exercise on a regular basis.   4. Bilateral carotid bruits No chest pain, headaches, weakness, or confusion. Does have intermittent dizziness with position changes. Has an upcoming carotid US and appointment with cardiology.   5. 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 Followed by cardiology on a regular basis and has an upcoming appointment in March. No chest pain, palpitations, weakness, or syncope.      Relevant past medical, surgical, family, and social history reviewed and updated as indicated.  Allergies and medications reviewed and updated. Date reviewed: Chart in Epic.   Past Medical History:  Diagnosis Date  . Cataract   . Dysphagia    Status post endoscopic dilation  . History of pneumothorax   . Hypertension   . Thoracic aortic dissection  - TYPE A 03/24/2019   s/p Open Repair - 28 mm Hemashield graft (Dr. Kipp Brood)    Past Surgical History:  Procedure Laterality Date  . EYE SURGERY Bilateral    cataracts   . THORACIC AORTIC ANEURYSM REPAIR N/A 03/24/2019   Procedure: THORACIC ASCENDING ANEURYSM REPAIR (AAA);  Surgeon: Lajuana Matte, MD;  Location: [facility];  Service: Open Heart Surgery;  Laterality: N/A;  . TRANSESOPHAGEAL ECHOCARDIOGRAM  03/24/2019   Intra-OP: EF 55-50%. No RWMA.  Non-concentric LVH.  Pre-surgical: Ascending Aorta noted dissection present w/ mobilization flap of Aortic Root, NOT involving Sinus of Valsalva. 70% of cross-sectional area of descending TAA. POST-REPAIR: NO AI (normal AO-Valve)    Social History   Socioeconomic History  . Marital status: Legally Separated    Spouse name: Not on file  . Number of children: 2  . Years of education: Not on file  . Highest education level: Not on file  Occupational History  . Occupation: unemployed    Tobacco Use  . Smoking status: Former Smoker    Packs/day: 1.00    Years: 5.00    Pack years: 5.00    Types: Cigarettes, Cigars    Quit date: 03/26/2009    Years since quitting: 10.3  . Smokeless tobacco: Never Used  Substance and Sexual Activity  . Alcohol use: Never  . Drug use: Never  . Sexual activity: Not on file  Other Topics Concern  . Not on file  Social History Narrative   Lives with son and daughter in law and their 4 children    Social Determinants of Health   Financial Resource Strain:   . Difficulty of Paying Living Expenses: Not on file  Food Insecurity:   . Worried About Charity fundraiser in the Last Year: Not on file  . Ran Out of Food in the Last Year: Not on file  Transportation Needs:   . Lack of Transportation (Medical): Not on file  . Lack of Transportation (Non-Medical): Not on file  Physical Activity:   . Days of Exercise per Week: Not on file  . Minutes of Exercise per Session: Not on file  Stress:   . Feeling of Stress : Not on file  Social Connections:   . Frequency of Communication with Friends and Family: Not on file  . Frequency of Social Gatherings with Friends and Family: Not on file  . Attends Religious Services: Not on file  . Active Member of Clubs or Organizations: Not on file  . Attends Archivist Meetings: Not on file  . Marital Status: Not on file  Intimate Partner Violence:   . Fear of Current or Ex-Partner: Not on file  . Emotionally Abused: Not on file  . Physically Abused: Not on file  . Sexually Abused: Not on file    Outpatient Encounter Medications as of 08/10/2019  Medication Sig  . acetaminophen (TYLENOL) 325 MG tablet Take 2 tablets (650 mg total) by mouth every 6 (six) hours as needed for mild pain, fever or headache.  [name] amLODipine (NORVASC) 10 MG tablet Take 1 tablet (10 mg total) by mouth daily.  [name] aspirin EC 325 MG EC tablet Take 1 tablet (325 mg total) by mouth daily.  [name] atorvastatin (LIPITOR) 40 MG  tablet TAKE 1 TABLET BY MOUTH ONCE DAILY AT  6  PM  . lisinopril (ZESTRIL) 40 MG tablet Take 1 tablet (40 mg total) by mouth daily.  . meclizine (ANTIVERT) 25 MG tablet Take 1 tablet (25 mg total) by mouth 3 (three) times daily as needed for dizziness.  . metoprolol tartrate (LOPRESSOR) 100 MG tablet Take 1 tablet (100 mg total) by mouth 2 (two) times daily.  . [DISCONTINUED] amLODipine (NORVASC) 10 MG tablet Take 1 tablet (10 mg total) by mouth daily.  . [DISCONTINUED]  atorvastatin (LIPITOR) 40 MG tablet TAKE 1 TABLET BY MOUTH ONCE DAILY AT  6  PM  . [DISCONTINUED] lisinopril (ZESTRIL) 40 MG tablet Take 1 tablet by mouth once daily  . [DISCONTINUED] metoprolol tartrate (LOPRESSOR) 100 MG tablet Take 1 tablet by mouth twice daily  . mupirocin ointment (BACTROBAN) 2 % Place 1 application into the nose 2 (two) times daily. (Patient not taking: Reported on 08/10/2019)  . [DISCONTINUED] oxyCODONE (OXY IR/ROXICODONE) 5 MG immediate release tablet Take 1 tablet (5 mg total) by mouth every 4 (four) hours as needed for severe pain.   No facility-administered encounter medications on file as of 08/10/2019.    No Known Allergies  Review of Systems  Constitutional: Negative for activity change, appetite change, chills, diaphoresis, fatigue, fever and unexpected weight change.  HENT: Negative.   Respiratory: Negative for cough, chest tightness and shortness of breath (some exertional SHOB, no resting SHOB).   Cardiovascular: Positive for leg swelling. Negative for chest pain and palpitations.  Gastrointestinal: Negative for abdominal pain, blood in stool, constipation, diarrhea, nausea and vomiting.  Endocrine: Negative.   Genitourinary: Negative for decreased urine volume, difficulty urinating, dysuria, frequency and urgency.  Musculoskeletal: Negative for arthralgias and myalgias.  Skin: Negative.   Allergic/Immunologic: Negative.   Neurological: Negative for dizziness, tremors, seizures, syncope,  facial asymmetry, speech difficulty, weakness, light-headedness, numbness and headaches.  Hematological: Negative.   Psychiatric/Behavioral: Negative for confusion, hallucinations, sleep disturbance and suicidal ideas.  All other systems reviewed and are negative.       Objective:  BP 116/67   Pulse (!) 58   Temp 98 F (36.7 C)   Resp 20   Ht '6\' 1"'  (1.854 m)   Wt 297 lb (134.7 kg)   SpO2 98%   BMI 39.18 kg/m    Wt Readings from Last 3 Encounters:  08/10/19 297 lb (134.7 kg)  07/05/19 286 lb (129.7 kg)  05/12/19 268 lb (121.6 kg)    Physical Exam Vitals and nursing note reviewed.  Constitutional:      General: He is not in acute distress.    Appearance: Normal appearance. He is well-developed and well-groomed. He is not ill-appearing, toxic-appearing or diaphoretic.  HENT:     Head: Normocephalic and atraumatic.     Jaw: There is normal jaw occlusion.     Right Ear: Hearing normal.     Left Ear: Hearing normal.     Nose: Nose normal.     Mouth/Throat:     Lips: Pink.     Mouth: Mucous membranes are moist.     Pharynx: Oropharynx is clear. Uvula midline.  Eyes:     General: Lids are normal.     Extraocular Movements: Extraocular movements intact.     Conjunctiva/sclera: Conjunctivae normal.     Pupils: Pupils are equal, round, and reactive to light.  Neck:     Thyroid: No thyroid mass, thyromegaly or thyroid tenderness.     Vascular: Carotid bruit (bilateral) present. No JVD.     Trachea: Trachea and phonation normal.  Cardiovascular:     Rate and Rhythm: Normal rate and regular rhythm.     Chest Wall: PMI is not displaced.     Pulses: Normal pulses.     Heart sounds: Murmur present. Systolic murmur present with a grade of 2/6. No friction rub. No gallop.   Pulmonary:     Effort: Pulmonary effort is normal. No respiratory distress.     Breath sounds: Normal breath sounds. No wheezing.  Abdominal:     General: Abdomen is protuberant. Bowel sounds are normal.  There is no distension or abdominal bruit.     Palpations: Abdomen is soft. There is no hepatomegaly or splenomegaly.     Tenderness: There is no abdominal tenderness. There is no right CVA tenderness or left CVA tenderness.     Hernia: No hernia is present.  Musculoskeletal:        General: Normal range of motion.     Cervical back: Normal range of motion and neck supple.     Right lower leg: No edema.     Left lower leg: No edema.  Lymphadenopathy:     Cervical: No cervical adenopathy.  Skin:    General: Skin is warm and dry.     Capillary Refill: Capillary refill takes less than 2 seconds.     Coloration: Skin is not cyanotic, jaundiced or pale.     Findings: No rash.  Neurological:     General: No focal deficit present.     Mental Status: He is alert and oriented to person, place, and time.     Cranial Nerves: Cranial nerves are intact.     Sensory: Sensation is intact. No sensory deficit.     Motor: Motor function is intact. No weakness.     Coordination: Coordination is intact. Coordination normal.     Gait: Gait is intact. Gait normal.     Deep Tendon Reflexes: Reflexes are normal and symmetric. Reflexes normal.  Psychiatric:        Attention and Perception: Attention and perception normal.        Mood and Affect: Mood and affect normal.        Speech: Speech normal.        Behavior: Behavior normal. Behavior is cooperative.        Thought Content: Thought content normal.        Cognition and Memory: Cognition and memory normal.        Judgment: Judgment normal.     Results for orders placed or performed in visit on 05/12/19  CBC with Differential/Platelet  Result Value Ref Range   WBC 8.1 3.4 - 10.8 x10E3/uL   RBC 5.05 4.14 - 5.80 x10E6/uL   Hemoglobin 14.3 13.0 - 17.7 g/dL   Hematocrit 43.4 37.5 - 51.0 %   MCV 86 79 - 97 fL   MCH 28.3 26.6 - 33.0 pg   MCHC 32.9 31.5 - 35.7 g/dL   RDW 13.7 11.6 - 15.4 %   Platelets 290 150 - 450 x10E3/uL   Neutrophils 55 Not  Estab. %   Lymphs 22 Not Estab. %   Monocytes 14 Not Estab. %   Eos 7 Not Estab. %   Basos 1 Not Estab. %   Neutrophils Absolute 4.5 1.4 - 7.0 x10E3/uL   Lymphocytes Absolute 1.8 0.7 - 3.1 x10E3/uL   Monocytes Absolute 1.2 (H) 0.1 - 0.9 x10E3/uL   EOS (ABSOLUTE) 0.5 (H) 0.0 - 0.4 x10E3/uL   Basophils Absolute 0.1 0.0 - 0.2 x10E3/uL   Immature Granulocytes 1 Not Estab. %   Immature Grans (Abs) 0.1 0.0 - 0.1 x10E3/uL  CMP14+EGFR  Result Value Ref Range   Glucose 94 65 - 99 mg/dL   BUN 13 6 - 24 mg/dL   Creatinine, Ser 0.93 0.76 - 1.27 mg/dL   GFR calc non Af Amer 91 >59 mL/min/1.73   GFR calc Af Amer 106 >59 mL/min/1.73   BUN/Creatinine Ratio 14 9 - 20  Sodium 142 134 - 144 mmol/L   Potassium 3.8 3.5 - 5.2 mmol/L   Chloride 102 96 - 106 mmol/L   CO2 24 20 - 29 mmol/L   Calcium 9.5 8.7 - 10.2 mg/dL   Total Protein 6.3 6.0 - 8.5 g/dL   Albumin 4.0 3.8 - 4.9 g/dL   Globulin, Total 2.3 1.5 - 4.5 g/dL   Albumin/Globulin Ratio 1.7 1.2 - 2.2   Bilirubin Total 0.3 0.0 - 1.2 mg/dL   Alkaline Phosphatase 104 39 - 117 IU/L   AST 13 0 - 40 IU/L   ALT 17 0 - 44 IU/L  Thyroid Panel With TSH  Result Value Ref Range   TSH 1.160 0.450 - 4.500 uIU/mL   T4, Total 6.6 4.5 - 12.0 ug/dL   T3 Uptake Ratio 24 24 - 39 %   Free Thyroxine Index 1.6 1.2 - 4.9       Pertinent labs & imaging results that were available during my care of the patient were reviewed by me and considered in my medical decision making.  Assessment & Plan:  Jeffrey Flynn was seen today for medical management of chronic issues, hyperlipidemia and hypertension.  Diagnoses and all orders for this visit:  Essential hypertension BP well controlled. Changes were not made in regimen today. Goal BP is 130/80. Pt aware to report any persistent high or low readings. DASH diet and exercise encouraged. Exercise at least 150 minutes per week and increase as tolerated. Goal BMI > 25. Stress management encouraged. Avoid nicotine and tobacco  product use. Avoid excessive alcohol and NSAID's. Avoid more than 2000 mg of sodium daily. Medications as prescribed. Follow up as scheduled.  -     amLODipine (NORVASC) 10 MG tablet; Take 1 tablet (10 mg total) by mouth daily. -     lisinopril (ZESTRIL) 40 MG tablet; Take 1 tablet (40 mg total) by mouth daily. -     metoprolol tartrate (LOPRESSOR) 100 MG tablet; Take 1 tablet (100 mg total) by mouth 2 (two) times daily. -     CMP14+EGFR; Future -     CBC with Differential/Platelet; Future  Dyslipidemia, goal LDL below 70 Diet and exercise encouraged. Labs pending. Continue medications as prescribed.  -     atorvastatin (LIPITOR) 40 MG tablet; TAKE 1 TABLET BY MOUTH ONCE DAILY AT  6  PM -     Lipid panel; Future  Morbid obesity ([place]) Diet and exercise encouraged. Pt will have lab work completed at upcoming cardiology visit.  -     CMP14+EGFR; Future -     CBC with Differential/Platelet; Future -     Lipid panel; Future  Bilateral carotid bruits Scheduled for upcoming doppler study. Continue below medications. Follow up with cardiology and PCP as scheduled.  -     atorvastatin (LIPITOR) 40 MG tablet; TAKE 1 TABLET BY MOUTH ONCE DAILY AT  6  PM -     metoprolol tartrate (LOPRESSOR) 100 MG tablet; Take 1 tablet (100 mg total) by mouth 2 (two) times daily.  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 Has upcoming appointment cardiology. Will continue below. Labs ordered.  -     atorvastatin (LIPITOR) 40 MG tablet; TAKE 1 TABLET BY MOUTH ONCE DAILY AT  6  PM -     metoprolol tartrate (LOPRESSOR) 100 MG tablet; Take 1 tablet (100 mg total) by mouth 2 (two) times daily.     Continue all other maintenance medications.  Follow up plan:  Return if symptoms worsen or fail to improve.  Continue healthy lifestyle choices, including diet (rich in fruits, vegetables, and lean proteins, and low in salt and simple carbohydrates) and exercise (at least 30 minutes of  moderate physical activity daily).   The above assessment and management plan was discussed with the patient. The patient verbalized understanding of and has agreed to the management plan. Patient is aware to call the clinic if they develop any new symptoms or if symptoms persist or worsen. Patient is aware when to return to the clinic for a follow-up visit. Patient educated on when it is appropriate to go to the emergency department.   Monia Pouch, FNP-C [facility] [phone]

## 2019-08-12 ENCOUNTER — Ambulatory Visit: Payer: Self-pay | Admitting: Family Medicine

## 2019-09-20 HISTORY — PX: OTHER SURGICAL HISTORY: SHX169

## 2019-09-20 HISTORY — PX: TRANSTHORACIC ECHOCARDIOGRAM: SHX275

## 2019-09-27 ENCOUNTER — Ambulatory Visit (HOSPITAL_COMMUNITY)
Admission: RE | Admit: 2019-09-27 | Discharge: 2019-09-27 | Disposition: A | Discharge status: Home / Self Care | Payer: Self-pay | Source: Ambulatory Visit | Attending: Cardiology | Admitting: Cardiology

## 2019-09-27 ENCOUNTER — Other Ambulatory Visit: Payer: Self-pay

## 2019-09-27 DIAGNOSIS — R011 Cardiac murmur, unspecified: Secondary | ICD-10-CM | POA: Insufficient documentation

## 2019-09-27 DIAGNOSIS — I7101 Dissection of thoracic aorta: Secondary | ICD-10-CM | POA: Insufficient documentation

## 2019-09-27 DIAGNOSIS — I71019 Dissection of thoracic aorta, unspecified: Secondary | ICD-10-CM

## 2019-09-27 NOTE — Progress Notes (Signed)
*  PRELIMINARY RESULTS* Echocardiogram 2D Echocardiogram has been performed.  Stacey Drain 09/27/2019, 9:12 AM

## 2019-10-04 ENCOUNTER — Ambulatory Visit (HOSPITAL_COMMUNITY)
Admission: RE | Admit: 2019-10-04 | Discharge: 2019-10-04 | Disposition: A | Discharge status: Home / Self Care | Payer: Self-pay | Source: Ambulatory Visit | Attending: Cardiovascular Disease | Admitting: Cardiovascular Disease

## 2019-10-04 ENCOUNTER — Other Ambulatory Visit: Payer: Self-pay

## 2019-10-04 DIAGNOSIS — R0989 Other specified symptoms and signs involving the circulatory and respiratory systems: Secondary | ICD-10-CM | POA: Insufficient documentation

## 2019-10-04 LAB — COMPREHENSIVE METABOLIC PANEL
ALT: 21 IU/L (ref 0–44)
AST: 24 IU/L (ref 0–40)
Albumin/Globulin Ratio: 2.1 (ref 1.2–2.2)
Albumin: 4.5 g/dL (ref 3.8–4.9)
Alkaline Phosphatase: 92 IU/L (ref 39–117)
BUN/Creatinine Ratio: 16 (ref 9–20)
BUN: 17 mg/dL (ref 6–24)
Bilirubin Total: 0.6 mg/dL (ref 0.0–1.2)
CO2: 22 mmol/L (ref 20–29)
Calcium: 9.1 mg/dL (ref 8.7–10.2)
Chloride: 105 mmol/L (ref 96–106)
Creatinine, Ser: 1.06 mg/dL (ref 0.76–1.27)
GFR calc Af Amer: 90 mL/min/{1.73_m2} (ref >59)
GFR calc non Af Amer: 78 mL/min/{1.73_m2} (ref >59)
Globulin, Total: 2.1 g/dL (ref 1.5–4.5)
Glucose: 105 mg/dL — ABNORMAL HIGH (ref 65–99)
Potassium: 4.4 mmol/L (ref 3.5–5.2)
Sodium: 142 mmol/L (ref 134–144)
Total Protein: 6.6 g/dL (ref 6.0–8.5)

## 2019-10-04 LAB — LIPID PANEL
Chol/HDL Ratio: 3.2 ratio (ref 0.0–5.0)
Cholesterol, Total: 96 mg/dL — ABNORMAL LOW (ref 100–199)
HDL: 30 mg/dL — ABNORMAL LOW (ref >39)
LDL Chol Calc (NIH): 48 mg/dL (ref 0–99)
Triglycerides: 89 mg/dL (ref 0–149)
VLDL Cholesterol Cal: 18 mg/dL (ref 5–40)

## 2019-11-02 ENCOUNTER — Encounter: Payer: Self-pay | Admitting: *Deleted

## 2019-11-15 ENCOUNTER — Ambulatory Visit (INDEPENDENT_AMBULATORY_CARE_PROVIDER_SITE_OTHER): Payer: Self-pay | Admitting: Cardiology

## 2019-11-15 ENCOUNTER — Encounter: Payer: Self-pay | Admitting: Cardiology

## 2019-11-15 ENCOUNTER — Other Ambulatory Visit: Payer: Self-pay

## 2019-11-15 VITALS — BP 130/80 | HR 53 | Temp 97.3°F | Ht 73.0 in | Wt 311.6 lb

## 2019-11-15 DIAGNOSIS — I7101 Dissection of thoracic aorta: Secondary | ICD-10-CM

## 2019-11-15 DIAGNOSIS — I10 Essential (primary) hypertension: Secondary | ICD-10-CM

## 2019-11-15 DIAGNOSIS — R011 Cardiac murmur, unspecified: Secondary | ICD-10-CM

## 2019-11-15 DIAGNOSIS — R0989 Other specified symptoms and signs involving the circulatory and respiratory systems: Secondary | ICD-10-CM

## 2019-11-15 DIAGNOSIS — I71019 Dissection of thoracic aorta, unspecified: Secondary | ICD-10-CM

## 2019-11-15 DIAGNOSIS — E785 Hyperlipidemia, unspecified: Secondary | ICD-10-CM

## 2019-11-15 MED ORDER — ASPIRIN EC 81 MG PO TBEC: 81.0000 mg | DELAYED_RELEASE_TABLET | Freq: Every day | ORAL | 3 refills | Status: AC

## 2019-11-15 NOTE — Patient Instructions (Signed)
Medication Instructions:  Decrease  Aspirin to 81 mg one tablet daily    Take your Lisinopril with morningtime  Metoprolol dose   Take your Amlodipine with nighttime  Metoprolol dose  *If you need a refill on your cardiac medications before your next appointment, please call your pharmacy*   Lab Work: Not needed    Testing/Procedures: Not needed   Follow-Up: At [facility], you and your health needs are our priority.  As part of our continuing mission to provide you with exceptional heart care, we have created designated Provider Care Teams.  These Care Teams include your primary Cardiologist (physician) and Advanced Practice Providers (APPs -  Physician Assistants and Nurse Practitioners) who all work together to provide you with the care you need, when you need it.  We recommend signing up for the patient portal called "MyChart".  Sign up information is provided on this After Visit Summary.  MyChart is used to connect with patients for Virtual Visits (Telemedicine).  Patients are able to view lab/test results, encounter notes, upcoming appointments, etc.  Non-urgent messages can be sent to your provider as well.   To learn more about what you can do with MyChart, go to [email or web address].    Your next appointment:   6 month(s) Oct 2021 after you see the surgeon   The format for your next appointment:   In Person  Provider:   Bryan Lemma, MD   Other Instructions

## 2019-11-15 NOTE — Progress Notes (Signed)
Primary Care Provider: Baruch Gouty, FNP Cardiologist: Glenetta Hew, MD CVTS: Dr. Kipp Brood   Clinic Note: Chief Complaint  Patient presents with  . Follow-up    Test results; no major complaints    HPI:    Jeffrey Flynn is a 59 y.o. 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  Jeffrey Flynn was last seen on July 05, 2019 -> was doing quite well.  The only thing he noted was an episode of vertigo lasting a few minutes.  Was not orthostatic at that time, but has had some mild orthostatic dizziness...  Noted some off-and-on musculoskeletal symptoms of the chest discomfort describes a tightness in the skin of the chest.  Notes a little bit of exertional fatigue and dyspnea.  Echo and carotid Dopplers ordered along with lipid panel  Recent Hospitalizations:   n/a  Reviewed  CV studies:    The following studies were reviewed today: (if available, images/films reviewed: From Epic Chart or Care Everywhere) . 09/2019: Echo -EF 55 to 60%.  There is inferior basal hypokinesis but otherwise normal wall motion.  Normal diastolic parameters.  Normal right heart with mild RA dilation.  Aortic valve annular thickening noted from resuspension of aortic valve.  No regurgitation noted.  Turbulent flow noted in the arch post anastomosis. . 09/2019: Carotid Duplex -bilateral cervical carotids with no dissection or significant atherosclerosis.  Antegrade flow in left vertebral artery.  Right vertebral artery has bidirectional flow and small caliber.  Bilateral scalene arteries are stenotic with flow disturbance.  There is only a 12 mm pressure difference in both arms   Interval History:   Jeffrey Flynn returns here today overall doing fairly well.  He says that he thinks his weight is probably gone up because he has not really been working that much since his surgery.  He has not really gone back to  full work and with COVID-19 the job as an visit.  He has not really been exercising that much besides playing with his grandkids.  He does have some mild pedal edema than the day but goes down at night.  He has occasional lightheadedness with, dizzy symptom that can be with or without standing.  Sometimes he notes his heart rate going up when he first stands.  He also says that when he lays down at night he feels his heart beating but otherwise has not had any significant palpitations.  Otherwise no dizziness, no syncope. He denies any chest tightness pressure with rest or exertion.  CV Review of Symptoms (Summary): positive for - edema and Mild exertional dyspnea, musculoskeletal chest pain, lightheadedness and vertiginous dizziness. negative for - irregular heartbeat, orthopnea, palpitations, paroxysmal nocturnal dyspnea, rapid heart rate, shortness of breath or Syncope with dizziness but not near syncope.  TIA/amaurosis fugax.  Claudication.  The patient DOES NOT have symptoms concerning for COVID-19 infection (fever, chills, cough, or new shortness of breath).  The patient is practicing social distancing and masking. He has had both of his COVID-19 vaccine injections.  REVIEWED OF SYSTEMS   A comprehensive ROS was performed. Review of Systems  Constitutional: Negative for malaise/fatigue (Energy level getting better) and weight loss (Weight gain).  HENT: Negative for congestion and nosebleeds.   Eyes: Negative for blurred vision.  Respiratory: Positive for shortness of breath (Not at rest). Negative for cough (Mild baseline) and wheezing.   Cardiovascular: Positive for leg swelling (  Mild swelling at the end the day, but not as bad since he is not yet back at work.). Negative for chest pain (More chest wall pain).  Gastrointestinal: Negative for blood in stool, heartburn and melena.  Genitourinary: Negative for flank pain and hematuria.  Musculoskeletal: Negative for falls.  Neurological:  Positive for dizziness (Per HPI). Negative for focal weakness and weakness.  Psychiatric/Behavioral: Negative.  Negative for memory loss. The patient is not nervous/anxious and does not have insomnia.    I have reviewed and (if needed) personally updated the patient's problem list, medications, allergies, past medical and surgical history, social and family history.   PAST MEDICAL HISTORY   Past Medical History:  Diagnosis Date  . Cataract   . Dysphagia    Status post endoscopic dilation  . History of pneumothorax   . Hypertension   . Thoracic aortic dissection  - TYPE A 03/24/2019   s/p Open Repair - 28 mm Hemashield graft (Dr. Cliffton AstersLightfoot)     PAST SURGICAL HISTORY   Past Surgical History:  Procedure Laterality Date  . Carotid Dopplers  09/2019   bilateral cervical carotids with no dissection or significant atherosclerosis.  Antegrade flow in left vertebral artery.  Right vertebral artery has bidirectional flow and small caliber.  Bilateral scalene arteries are stenotic with flow disturbance.  There is only a 12 mm pressure difference in both arms  . EYE SURGERY Bilateral    cataracts   . THORACIC AORTIC ANEURYSM REPAIR N/A 03/24/2019   Procedure: THORACIC ASCENDING ANEURYSM REPAIR (AAA);  Surgeon: Corliss SkainsLightfoot, Harrell O, MD;  [facility];  Service: Open Heart Surgery;  Laterality: N/A;  . TRANSESOPHAGEAL ECHOCARDIOGRAM  03/24/2019   Intra-OP: EF 55-50%. No RWMA.  Non-concentric LVH.  Pre-surgical: Ascending Aorta noted dissection present w/ mobilization flap of Aortic Root, NOT involving Sinus of Valsalva. 70% of cross-sectional area of descending TAA. POST-REPAIR: NO AI (normal AO-Valve)  . TRANSTHORACIC ECHOCARDIOGRAM  09/2019   Post aortic root replacement with valve sparing: EF 55 to 60%.  There is inferior basal hypokinesis but otherwise normal wall motion.  Normal diastolic parameters.  Normal right heart with mild RA dilation.  Aortic valve annular thickening noted from  resuspension of aortic valve.  No regurgitation noted.  Turbulent flow noted in the arch post anastomosis.     MEDICATIONS/ALLERGIES   Current Meds  Medication Sig  . acetaminophen (TYLENOL) 325 MG tablet Take 2 tablets (650 mg total) by mouth every 6 (six) hours as needed for mild pain, fever or headache.  [name]. atorvastatin (LIPITOR) 40 MG tablet TAKE 1 TABLET BY MOUTH ONCE DAILY AT  6  PM  . lisinopril (ZESTRIL) 40 MG tablet Take 1 tablet (40 mg total) by mouth daily.  . meclizine (ANTIVERT) 25 MG tablet Take 1 tablet (25 mg total) by mouth 3 (three) times daily as needed for dizziness.  . mupirocin ointment (BACTROBAN) 2 % Place 1 application into the nose 2 (two) times daily.  . [DISCONTINUED] aspirin EC 325 MG EC tablet Take 1 tablet (325 mg total) by mouth daily.    No Known Allergies   SOCIAL HISTORY/FAMILY HISTORY   Social History   Tobacco Use  . Smoking status: Former Smoker    Packs/day: 1.00    Years: 5.00    Pack years: 5.00    Types: Cigarettes, Cigars    Quit date: 03/26/2009    Years since quitting: 10.6  . Smokeless tobacco: Never Used  Substance Use Topics  . Alcohol  use: Never  . Drug use: Never   Social History   Social History Narrative   Lives with son and daughter in law and their 4 children     Family History family history includes Bone cancer in his father; Cancer in his brother and brother; Diabetes in his son; Diabetes type II in his paternal grandfather; Heart attack in his maternal grandmother and mother; Hernia in his sister; Hypertension in his brother, brother, mother, sister, sister, sister, and sister; Neuropathy in his brother; Ovarian cancer in his sister; Prostate cancer in his maternal grandfather; Stroke in his brother.   OBJCTIVE -PE, EKG, labs   Wt Readings from Last 3 Encounters:  11/15/19 (!) 311 lb 9.6 oz (141.3 kg)  08/10/19 297 lb (134.7 kg)  07/05/19 286 lb (129.7 kg)    Physical Exam: BP 130/80   Pulse (!) 53   Temp  (!) 97.3 F (36.3 C)   Ht 6\' 1"  (1.854 m)   Wt (!) 311 lb 9.6 oz (141.3 kg)   SpO2 97%   BMI 41.11 kg/m  Physical Exam  Constitutional: He is oriented to person, place, and time. He appears well-developed and well-nourished. No distress.  Healthy-appearing.  Well-groomed.  HENT:  Head: Normocephalic and atraumatic.  Neck: No hepatojugular reflux and no JVD present. Carotid bruit is present (Cannot exclude radiated aortic murmur.).  Cardiovascular: Normal rate and regular rhythm.  Occasional extrasystoles are present. PMI is not displaced. Exam reveals no gallop and no friction rub.  Murmur (2/6C-D SEM at RUSB.  Radiates to neck.) heard. Pulmonary/Chest: Effort normal and breath sounds normal. No respiratory distress. He has no wheezes. He has no rales.  Abdominal: Soft. Bowel sounds are normal. He exhibits no distension. There is no abdominal tenderness. There is no rebound.  Truncal obesity.  Unable to assess HSM  Musculoskeletal:        General: Edema (Trivial) present.     Cervical back: Normal range of motion and neck supple.  Neurological: He is alert and oriented to person, place, and time.  Psychiatric: He has a normal mood and affect. His behavior is normal. Judgment and thought content normal.  Vitals reviewed.   Adult ECG Report  Rate: 53;  Rhythm: sinus bradycardia and 1 degree AVB.  ST and T wave changes with anterior inversion (somewhat subtle flattening with mild inversion).  Otherwise normal axis intervals and durations.;   Narrative Interpretation: Essentially stable   Recent Labs:    Lab Results  Component Value Date   CHOL 96 (L) 10/04/2019   HDL 30 (L) 10/04/2019   LDLCALC 48 10/04/2019   TRIG 89 10/04/2019   CHOLHDL 3.2 10/04/2019   Lab Results  Component Value Date   CREATININE 1.06 10/04/2019   BUN 17 10/04/2019   NA 142 10/04/2019   K 4.4 10/04/2019   CL 105 10/04/2019   CO2 22 10/04/2019    ASSESSMENT/PLAN    Problem List Items Addressed  This Visit    Dissection of thoracic aorta -- s/p Hemasheild repair, Normal Aortic Valve. - Primary (Chronic)    Doing relatively well now postop.  The only issue is he has not lost the weight back.  We will reduce him to 81 mg aspirin.  Continue blood pressure control.  May need to assess subclavian artery stenosis with CTA, but he is not having any signs of subclavian steal.      Relevant Medications   aspirin EC 81 MG tablet   Essential hypertension (Chronic)  Blood pressures pretty stable, with him having some dizzy spells in the morning I recommend he split up his blood pressure medications and take lisinopril the morning with the morning metoprolol dose and then amlodipine at nighttime.      Relevant Medications   aspirin EC 81 MG tablet   Other Relevant Orders   EKG 12-Lead (Completed)   Morbid obesity (HCC) (Chronic)    He still gaining weight from his surgery.  I admonished him to really get back into exercise regimen and work on losing weight.  Needs to watch his diet as well.      Dyslipidemia, goal LDL below 70 (Chronic)    Lipids still look great on recent check. He is on atorvastatin 40 mg daily.  Continue current dose.      Relevant Medications   aspirin EC 81 MG tablet   Other Relevant Orders   EKG 12-Lead (Completed)   Systolic ejection murmur    Echo did not show any significant aortic stenosis.  Probably due to sewing ring with turbulence.      Relevant Orders   EKG 12-Lead (Completed)   Carotid bruit    By Doppler, carotids actually okay.  It is the bilateral subclavian arteries that seem to have some narrowing.  This probably is related to his dissection.  Will defer to Dr. Cliffton Asters as whether or not this is something we need to be concerned about.  Low threshold to consider CTA.          COVID-19 Education: The signs and symptoms of COVID-19 were discussed with the patient and how to seek care for testing (follow up with PCP or arrange E-visit).    The importance of social distancing was discussed today.  I spent a total of with the patient and chart review. >  50% of the time was spent in direct patient consultation.  Additional time spent with chart review (studies, outside notes, etc): 8 Total Time: 24 min   Current medicines are reviewed at length with the patient today.  (+/- concerns) n/a   Patient Instructions / Medication Changes & Studies & Tests Ordered   Patient Instructions  Medication Instructions:  Decrease  Aspirin to 81 mg one tablet daily    Take your Lisinopril with morningtime  Metoprolol dose   Take your Amlodipine with nighttime  Metoprolol dose  *If you need a refill on your cardiac medications before your next appointment, please call your pharmacy*   Lab Work: Not needed    Testing/Procedures: Not needed   Follow-Up: At [facility], you and your health needs are our priority.  As part of our continuing mission to provide you with exceptional heart care, we have created designated Provider Care Teams.  These Care Teams include your primary Cardiologist (physician) and Advanced Practice Providers (APPs -  Physician Assistants and Nurse Practitioners) who all work together to provide you with the care you need, when you need it.  We recommend signing up for the patient portal called "MyChart".  Sign up information is provided on this After Visit Summary.  MyChart is used to connect with patients for Virtual Visits (Telemedicine).  Patients are able to view lab/test results, encounter notes, upcoming appointments, etc.  Non-urgent messages can be sent to your provider as well.   To learn more about what you can do with MyChart, go to [email or web address].    Your next appointment:   6 month(s) Oct 2021 after you see the surgeon   The  format for your next appointment:   In Person  Provider:   Bryan Lemma, MD   Other Instructions     Studies Ordered:   Orders Placed  This Encounter  Procedures  . EKG 12-Lead     Bryan Lemma, M.D., M.S. Interventional Cardiologist   Pager # [phone] Phone # [phone] [address] [place], [place] [address]   Thank you for choosing Heartcare at [facility]!!

## 2019-11-17 ENCOUNTER — Encounter: Payer: Self-pay | Admitting: Cardiology

## 2019-11-17 NOTE — Assessment & Plan Note (Signed)
Doing relatively well now postop.  The only issue is he has not lost the weight back.  We will reduce him to 81 mg aspirin.  Continue blood pressure control.  May need to assess subclavian artery stenosis with CTA, but he is not having any signs of subclavian steal.

## 2019-11-17 NOTE — Assessment & Plan Note (Signed)
By Doppler, carotids actually okay.  It is the bilateral subclavian arteries that seem to have some narrowing.  This probably is related to his dissection.  Will defer to Dr. Cliffton Asters as whether or not this is something we need to be concerned about.  Low threshold to consider CTA.

## 2019-11-17 NOTE — Assessment & Plan Note (Signed)
Blood pressures pretty stable, with him having some dizzy spells in the morning I recommend he split up his blood pressure medications and take lisinopril the morning with the morning metoprolol dose and then amlodipine at nighttime.

## 2019-11-17 NOTE — Assessment & Plan Note (Signed)
Echo did not show any significant aortic stenosis.  Probably due to sewing ring with turbulence.

## 2019-11-17 NOTE — Assessment & Plan Note (Signed)
He still gaining weight from his surgery.  I admonished him to really get back into exercise regimen and work on losing weight.  Needs to watch his diet as well.

## 2019-11-17 NOTE — Assessment & Plan Note (Signed)
Lipids still look great on recent check. He is on atorvastatin 40 mg daily.  Continue current dose.

## 2020-02-07 ENCOUNTER — Ambulatory Visit (INDEPENDENT_AMBULATORY_CARE_PROVIDER_SITE_OTHER): Payer: Self-pay | Admitting: Family Medicine

## 2020-02-07 ENCOUNTER — Other Ambulatory Visit: Payer: Self-pay

## 2020-02-07 ENCOUNTER — Encounter: Payer: Self-pay | Admitting: Family Medicine

## 2020-02-07 ENCOUNTER — Ambulatory Visit: Payer: Self-pay | Admitting: Family Medicine

## 2020-02-07 VITALS — BP 115/63 | HR 51 | Temp 97.9°F | Ht 73.0 in | Wt 299.0 lb

## 2020-02-07 DIAGNOSIS — I10 Essential (primary) hypertension: Secondary | ICD-10-CM

## 2020-02-07 DIAGNOSIS — E785 Hyperlipidemia, unspecified: Secondary | ICD-10-CM

## 2020-02-07 NOTE — Progress Notes (Signed)
BP 115/63   Pulse (!) 51   Temp 97.9 F (36.6 C)   Ht _0  (1.854 m)   Wt 299 lb (135.6 kg)   SpO2 97%   BMI 39.45 kg/m    Subjective:   Patient ID: Jeffrey Flynn, male    DOB: January 29, 1961, 59 y.o.   MRN: [id number]  HPI: Jeffrey Flynn is a 59 y.o. male presenting on 02/07/2020 for Medical Management of Chronic Issues and Hypertension   HPI Hypertension Patient is currently on amlodipine and lisinopril and metoprolol, and their blood pressure today is 115/63.  Patient complains of the occasional orthostatic dizziness, it has improved. Patient denies headaches, blurred vision, chest pains, shortness of breath, or weakness. Denies any side effects from medication and is content with current medication.   Hyperlipidemia Patient is coming in for recheck of his hyperlipidemia. The patient is currently taking atorvastatin. They deny any issues with myalgias or history of liver damage from it. They deny any focal numbness or weakness or chest pain.   Patient's hypertension and hyperlipidemia are more complicated by the patient's morbid obesity.  Discussed weight loss and lifestyle modification and exercise with the patient.   Patient had an aortic dissection repair number of last year supposed to have cardiology follow-up and surgery follow-up Tober of this year, he will continue to call them and try to the appointment  Relevant past medical, surgical, family and social history reviewed and updated as indicated. Interim medical history since our last visit reviewed. Allergies and medications reviewed and updated.  Review of Systems  Constitutional: Negative for chills and fever.  Eyes: Negative for visual disturbance.  Respiratory: Negative for shortness of breath and wheezing.   Cardiovascular: Negative for chest pain and leg swelling.  Musculoskeletal: Negative for back pain and gait problem.  Skin: Negative for rash.  Neurological: Negative for dizziness, weakness and numbness.  All  other systems reviewed and are negative.   Per HPI unless specifically indicated above   Allergies as of 02/07/2020   No Known Allergies     Medication List       Accurate as of February 07, 2020  8:21 AM. If you have any questions, ask your nurse or doctor.        STOP taking these medications   acetaminophen 325 MG tablet Commonly known as: TYLENOL Stopped by: Fransisca Kaufmann Manraj Yeo, MD     TAKE these medications   amLODipine 10 MG tablet Commonly known as: NORVASC Take 1 tablet (10 mg total) by mouth daily.   aspirin EC 81 MG tablet Take 1 tablet (81 mg total) by mouth daily.   atorvastatin 40 MG tablet Commonly known as: LIPITOR TAKE 1 TABLET BY MOUTH ONCE DAILY AT  6  PM   lisinopril 40 MG tablet Commonly known as: ZESTRIL Take 1 tablet (40 mg total) by mouth daily.   meclizine 25 MG tablet Commonly known as: ANTIVERT Take 1 tablet (25 mg total) by mouth 3 (three) times daily as needed for dizziness.   metoprolol tartrate 100 MG tablet Commonly known as: LOPRESSOR Take 1 tablet (100 mg total) by mouth 2 (two) times daily.   mupirocin ointment 2 % Commonly known as: Bactroban Place 1 application into the nose 2 (two) times daily.        Objective:   BP 115/63   Pulse (!) 51   Temp 97.9 F (36.6 C)   Ht _1  (1.854 m)   Wt 299 lb (135.6 kg)  SpO2 97%   BMI 39.45 kg/m   Wt Readings from Last 3 Encounters:  02/07/20 299 lb (135.6 kg)  11/15/19 (!) 311 lb 9.6 oz (141.3 kg)  08/10/19 297 lb (134.7 kg)    Physical Exam Vitals and nursing note reviewed.  Constitutional:      General: He is not in acute distress.    Appearance: He is well-developed. He is not diaphoretic.  Eyes:     General: No scleral icterus.    Conjunctiva/sclera: Conjunctivae normal.  Neck:     Thyroid: No thyromegaly.  Cardiovascular:     Rate and Rhythm: Normal rate and regular rhythm.     Heart sounds: Murmur (Systolic 3 out of 6 murmur in the aortic region) heard.    Pulmonary:     Effort: Pulmonary effort is normal. No respiratory distress.     Breath sounds: Normal breath sounds. No wheezing.  Musculoskeletal:        General: Normal range of motion.     Cervical back: Neck supple.  Lymphadenopathy:     Cervical: No cervical adenopathy.  Skin:    General: Skin is warm and dry.     Findings: No rash.  Neurological:     Mental Status: He is alert and oriented to person, place, and time.     Coordination: Coordination normal.  Psychiatric:        Behavior: Behavior normal.       Assessment & Plan:   Problem List Items Addressed This Visit      Cardiovascular and Mediastinum   Essential hypertension - Primary (Chronic)   Relevant Orders   CBC with Differential/Platelet   CMP14+EGFR     Other   Morbid obesity (HCC) (Chronic)   Dyslipidemia, goal LDL below 70 (Chronic)   Relevant Orders   Lipid panel      Patient has a little bit of swelling in his legs, could be from the amlodipine but it not bad enough to warrant a change at this point.  Patient has a little bit of exertional dyspnea although it is improving, he has been kind of down since the surgery last year started working a few weeks ago. Follow up plan: Return in about 6 months (around 08/09/2020), or if symptoms worsen or fail to improve, for Hypertension and cholesterol.  Counseling provided for all of the vaccine components Orders Placed This Encounter  Procedures  . CBC with Differential/Platelet  . CMP14+EGFR  . Lipid panel    Caryl Pina, MD [facility] 02/07/2020, 8:21 AM

## 2020-03-17 ENCOUNTER — Telehealth: Payer: Self-pay | Admitting: Cardiology

## 2020-03-17 NOTE — Telephone Encounter (Signed)
Attempted to contact patient to get follow up scheduled with Jeffrey Flynn from recall list

## 2020-04-07 ENCOUNTER — Other Ambulatory Visit: Payer: Self-pay | Admitting: *Deleted

## 2020-04-07 DIAGNOSIS — I71019 Dissection of thoracic aorta, unspecified: Secondary | ICD-10-CM

## 2020-04-21 ENCOUNTER — Ambulatory Visit
Admission: RE | Admit: 2020-04-21 | Discharge: 2020-04-21 | Disposition: A | Discharge status: Home / Self Care | Payer: No Typology Code available for payment source | Source: Ambulatory Visit | Attending: Thoracic Surgery (Cardiothoracic Vascular Surgery) | Admitting: Thoracic Surgery (Cardiothoracic Vascular Surgery)

## 2020-04-21 ENCOUNTER — Encounter: Payer: Self-pay | Admitting: Thoracic Surgery (Cardiothoracic Vascular Surgery)

## 2020-04-21 ENCOUNTER — Other Ambulatory Visit: Payer: Self-pay

## 2020-04-21 ENCOUNTER — Ambulatory Visit (INDEPENDENT_AMBULATORY_CARE_PROVIDER_SITE_OTHER): Payer: Self-pay | Admitting: Thoracic Surgery (Cardiothoracic Vascular Surgery)

## 2020-04-21 VITALS — BP 130/80 | HR 52 | Temp 97.6°F | Resp 20 | Ht 73.0 in | Wt 300.0 lb

## 2020-04-21 DIAGNOSIS — I71019 Dissection of thoracic aorta, unspecified: Secondary | ICD-10-CM

## 2020-04-21 DIAGNOSIS — I7101 Dissection of thoracic aorta: Secondary | ICD-10-CM

## 2020-04-21 MED ORDER — IOPAMIDOL (ISOVUE-370) INJECTION 76%
75.0000 mL | Freq: Once | INTRAVENOUS | Status: AC | PRN
  Administered 2020-04-21: 75 mL via INTRAVENOUS

## 2020-04-21 NOTE — Progress Notes (Signed)
[address]       [place] [address]             [phone]                    [facility] Medical Record #[id number] Date of Birth: [birth year]  Referring: Terrilee Files, MD Primary Care: Sonny Masters, FNP (Inactive) Primary Cardiologist: Bryan Lemma, MD  Chief Complaint:    Chief Complaint  Patient presents with  . Aortic Stenosis    1 year f/u with Chest CTA    History of Present Illness:    Jeffrey Flynn 59 y.o. male comes in for his 1 year surveillance CTA.  He underwent a dissection repair in September 2020.  This hospitalization was uncomplicated.  He does continue to have some vertigo which was present after the operation.  It is intermittent course.  Outside of this he has no other complaints.  He has been checking his blood pressure at home and his systolics are in the 120s to 130s    Past Medical History:  Diagnosis Date  . Cataract   . Dysphagia    Status post endoscopic dilation  . History of pneumothorax   . Hypertension   . Thoracic aortic dissection  - TYPE A 03/24/2019   s/p Open Repair - 28 mm Hemashield graft (Dr. Cliffton Asters)    Past Surgical History:  Procedure Laterality Date  . Carotid Dopplers  09/2019   bilateral cervical carotids with no dissection or significant atherosclerosis.  Antegrade flow in left vertebral artery.  Right vertebral artery has bidirectional flow and small caliber.  Bilateral scalene arteries are stenotic with flow disturbance.  There is only a 12 mm pressure difference in both arms  . EYE SURGERY Bilateral    cataracts   . THORACIC AORTIC ANEURYSM REPAIR N/A 03/24/2019   Procedure: THORACIC ASCENDING ANEURYSM REPAIR (AAA);  Surgeon: Corliss Skains, MD;  Location: [facility] OR;  Service: Open Heart Surgery;  Laterality: N/A;  . TRANSESOPHAGEAL ECHOCARDIOGRAM  03/24/2019   Intra-OP: EF 55-50%. No RWMA.  Non-concentric LVH.  Pre-surgical: Ascending Aorta noted dissection present w/  mobilization flap of Aortic Root, NOT involving Sinus of Valsalva. 70% of cross-sectional area of descending TAA. POST-REPAIR: NO AI (normal AO-Valve)  . TRANSTHORACIC ECHOCARDIOGRAM  09/2019   Post aortic root replacement with valve sparing: EF 55 to 60%.  There is inferior basal hypokinesis but otherwise normal wall motion.  Normal diastolic parameters.  Normal right heart with mild RA dilation.  Aortic valve annular thickening noted from resuspension of aortic valve.  No regurgitation noted.  Turbulent flow noted in the arch post anastomosis.    Family History  Problem Relation Age of Onset  . Hypertension Mother   . Heart attack Mother   . Bone cancer Father   . Ovarian cancer Sister   . Stroke Brother   . Heart attack Maternal Grandmother   . Prostate cancer Maternal Grandfather   . Diabetes type II Paternal Grandfather   . Cancer Brother        lung  . Neuropathy Brother   . Cancer Brother        unknown  . Hypertension Brother   . Hypertension Brother   . Hypertension Sister   . Hypertension Sister   . Hernia Sister   . Hypertension Sister   . Hypertension Sister   . Diabetes Son  amputee     Social History   Tobacco Use  Smoking Status Former Smoker  . Packs/day: 1.00  . Years: 5.00  . Pack years: 5.00  . Types: Cigarettes, Cigars  . Quit date: 03/26/2009  . Years since quitting: 11.0  Smokeless Tobacco Never Used    Social History   Substance and Sexual Activity  Alcohol Use Never     No Known Allergies  Current Outpatient Medications  Medication Sig Dispense Refill  . amLODipine (NORVASC) 10 MG tablet Take 1 tablet (10 mg total) by mouth daily. 30 tablet 11  . aspirin EC 81 MG tablet Take 1 tablet (81 mg total) by mouth daily. 90 tablet 3  . atorvastatin (LIPITOR) 40 MG tablet TAKE 1 TABLET BY MOUTH ONCE DAILY AT  6  PM 30 tablet 11  . lisinopril (ZESTRIL) 40 MG tablet Take 1 tablet (40 mg total) by mouth daily. 30 tablet 11  . meclizine  (ANTIVERT) 25 MG tablet Take 1 tablet (25 mg total) by mouth 3 (three) times daily as needed for dizziness. 20 tablet 0  . metoprolol tartrate (LOPRESSOR) 100 MG tablet Take 1 tablet (100 mg total) by mouth 2 (two) times daily. 60 tablet 11  . mupirocin ointment (BACTROBAN) 2 % Place 1 application into the nose 2 (two) times daily. 22 g 0   No current facility-administered medications for this visit.    Review of Systems  Constitutional: Negative.   HENT: Negative.   Respiratory: Negative.   Cardiovascular: Negative.   Neurological: Positive for sensory change.    PHYSICAL EXAMINATION: BP 130/80   Pulse (!) 52   Temp 97.6 F (36.4 C) (Skin)   Resp 20   Ht 6\' 1"  (1.854 m)   Wt 300 lb (136.1 kg)   SpO2 96% Comment: RA  BMI 39.58 kg/m   Physical Exam Constitutional:      General: He is not in acute distress.    Appearance: Normal appearance. He is not ill-appearing.  Eyes:     Extraocular Movements: Extraocular movements intact.     Conjunctiva/sclera: Conjunctivae normal.  Cardiovascular:     Rate and Rhythm: Normal rate.  Pulmonary:     Effort: Pulmonary effort is normal. No respiratory distress.  Musculoskeletal:     Cervical back: Normal range of motion.  Neurological:     General: No focal deficit present.     Mental Status: He is alert and oriented to person, place, and time.      Diagnostic Studies & Laboratory data:     Recent Radiology Findings:   CT ANGIO CHEST AORTA W/CM & OR WO/CM  Result Date: 04/21/2020 CLINICAL DATA:  59 year old with history of type A aortic dissection. Status post surgical repair. EXAM: CT ANGIOGRAPHY CHEST WITH CONTRAST TECHNIQUE: Multidetector CT imaging of the chest was performed using the standard protocol during bolus administration of intravenous contrast. Multiplanar CT image reconstructions and MIPs were obtained to evaluate the vascular anatomy. CONTRAST:  54mL ISOVUE-370 IOPAMIDOL (ISOVUE-370) INJECTION 76% COMPARISON:   04/09/2019 and 03/24/2019 FINDINGS: Cardiovascular: Again noted is repair of the ascending thoracic aorta with a surgical graft. The surgical graft is patent. Proximal graft anastomosis is near the sinotubular junction. The native aortic root measures roughly 4.0 cm and stable. Main left coronary artery and right coronary artery are patent. Again noted is a dissection involving the aortic arch and descending thoracic aorta. Dissection involves the innominate artery. The innominate artery, right common carotid artery and right subclavian artery are  patent. Proximal right vertebral artery is patent but small. The left common carotid artery and left subclavian artery originate off the true lumen. The left common carotid artery and left subclavian artery are patent. Proximal left vertebral artery is patent. Postsurgical changes at the right axilla. Aortic arch dissection measures up to 4.6 cm and similar to the prior examination. Proximal descending thoracic aorta has slightly enlarged measuring 5.0 cm and previously measuring 4.6 cm. The true lumen is along is along the anterior aspect of the descending thoracic aorta and smaller than the false lumen. Mid descending thoracic aorta measures 4.9 cm and previously measured 4.4 cm. Distal descending thoracic aorta measures 3.9 cm and previously measured 3.8 cm. No fluid or inflammatory changes around the descending thoracic aorta. Small amount of mural thrombus along the right side of the dissection flap in the proximal descending thoracic aorta, best seen on sequence 12, image 51. Normal caliber of the main pulmonary arteries. Limited evaluation of the pulmonary arteries due to the phase of contrast. Heart size is normal without significant pericardial fluid. Aortic dissection extends into the abdominal aorta. Celiac trunk and SMA are patent and originate from the true lumen. Limited evaluation of the renal arteries on this examination. Aorta just above the celiac trunk  measures up to 3.7 cm in AP dimension and previously measured 3.1 cm. Aorta at the level of the renal arteries measures 3.1 cm and previously measured 2.9 cm. Mediastinum/Nodes: Visualized thyroid tissue is unremarkable. No significant lymph node enlargement in the mediastinum, hila or axillary regions. Negative for mediastinal hematoma. Lungs/Pleura: No large pleural effusions. Small amount of material along the right side of the trachea on sequence 14, image 21 likely represents adherent mucus. Trachea and mainstem bronchi are patent. Stable focal nodularity along the right major fissure on sequence 14, image 86. Stable triangular-shaped density near the right middle lobe on sequence 14, image 93 and measures roughly 5 mm. Stable thickening or focal nodularity involving the left major fissure on sequence 14, image 84. Question a new 3 mm peripheral nodule in the left lower lobe on sequence 14, image 89. No significant airspace disease or lung consolidation. Stable elongated nodular density in the left lower lobe measuring 8 mm on sequence 14, image 116. Upper Abdomen: Images of the upper abdomen are unremarkable. Musculoskeletal: No acute bone abnormality. Median sternotomy wires. Review of the MIP images confirms the above findings. IMPRESSION: 1. Surgical repair of the ascending thoracic aorta for aortic dissection. Stable appearance of the surgical graft without complicating features. 2. Chronic aortic dissection involving the aortic arch, descending thoracic aorta and visualized abdominal aorta. Interval enlargement of the descending thoracic aorta and proximal abdominal aorta. Proximal descending thoracic aorta now measures 5.0 cm and previously measured 4.6 cm. Proximal abdominal aorta now measures 3.7 cm and previously measured 3.1 cm. 3. Scattered small pulmonary nodules. Majority of these pulmonary nodules are unchanged and likely benign. Question of a new 3 mm nodule in the left lower lobe. Recommend  attention to this area on follow up imaging. Electronically Signed   By: Richarda OverlieAdam  Henn M.D.   On: 04/21/2020 09:06       I have independently reviewed the above radiology studies  and reviewed the findings with the patient.   Recent Lab Findings: Lab Results  Component Value Date   WBC 8.1 05/12/2019   HGB 14.3 05/12/2019   HCT 43.4 05/12/2019   PLT 290 05/12/2019   GLUCOSE 105 (H) 10/04/2019   CHOL 96 (L)  10/04/2019   TRIG 89 10/04/2019   HDL 30 (L) 10/04/2019   LDLCALC 48 10/04/2019   ALT 21 10/04/2019   AST 24 10/04/2019   NA 142 10/04/2019   K 4.4 10/04/2019   CL 105 10/04/2019   CREATININE 1.06 10/04/2019   BUN 17 10/04/2019   CO2 22 10/04/2019   TSH 1.160 05/12/2019   INR 1.5 (H) 03/25/2019   HGBA1C 5.6 04/09/2019        Assessment / Plan:   59 year old male status post type A aortic dissection repair with hemiarch.  I personally reviewed his CTA.  Repair remained stable.  The dissection does go through his arch down his descending aorta.  The arch is stable in size at 4.6 cm.  The descending thoracic aorta is grown to 4.6 cm to 5 cm.  The proximal abdominal aorta is 3.7 cm 3.1 cm.  I reiterated the importance of tight blood pressure control and he states that he has been compliant we have discussed the risk factors for ongoing dilation signs that would prompt emergency care.  I have ordered another CTA in 1 year for ongoing surveillance.  I have also referred him to ENT for evaluation of his vertigo.      Corliss Skains 04/21/2020 2:25 PM

## 2020-08-09 ENCOUNTER — Ambulatory Visit: Payer: Self-pay | Admitting: Family Medicine

## 2020-08-15 ENCOUNTER — Encounter: Payer: Self-pay | Admitting: Cardiology

## 2020-08-18 ENCOUNTER — Ambulatory Visit (INDEPENDENT_AMBULATORY_CARE_PROVIDER_SITE_OTHER): Payer: Self-pay | Admitting: Family Medicine

## 2020-08-18 ENCOUNTER — Encounter: Payer: Self-pay | Admitting: Family Medicine

## 2020-08-18 ENCOUNTER — Other Ambulatory Visit: Payer: Self-pay

## 2020-08-18 DIAGNOSIS — E785 Hyperlipidemia, unspecified: Secondary | ICD-10-CM

## 2020-08-18 DIAGNOSIS — I10 Essential (primary) hypertension: Secondary | ICD-10-CM

## 2020-08-18 MED ORDER — METOPROLOL TARTRATE 100 MG PO TABS: 100.0000 mg | ORAL_TABLET | Freq: Two times a day (BID) | ORAL | 3 refills | Status: DC

## 2020-08-18 MED ORDER — LISINOPRIL 40 MG PO TABS: 40.0000 mg | ORAL_TABLET | Freq: Every day | ORAL | 3 refills | Status: DC

## 2020-08-18 MED ORDER — ATORVASTATIN CALCIUM 40 MG PO TABS: ORAL_TABLET | ORAL | 3 refills | Status: DC

## 2020-08-18 MED ORDER — AMLODIPINE BESYLATE 10 MG PO TABS: 10.0000 mg | ORAL_TABLET | Freq: Every day | ORAL | 3 refills | Status: DC

## 2020-08-18 NOTE — Progress Notes (Signed)
BP 114/73   Pulse 61   Ht '6\' 1"'  (1.854 m)   Wt (!) 304 lb (137.9 kg)   SpO2 95%   BMI 40.11 kg/m    Subjective:   Patient ID: Jeffrey Flynn, male    DOB: 06-02-1961, 60 y.o.   MRN: [id number]  HPI: Jeffrey Flynn is a 60 y.o. male presenting on 08/18/2020 for No chief complaint on file.   HPI Hypertension Patient is currently on amlodipine and lisinopril and metoprolol, and their blood pressure today is 114/73. Patient denies any lightheadedness or dizziness. Patient denies headaches, blurred vision, chest pains, shortness of breath, or weakness. Denies any side effects from medication and is content with current medication.   Hyperlipidemia Patient is coming in for recheck of his hyperlipidemia. The patient is currently taking atorvastatin. They deny any issues with myalgias or history of liver damage from it. They deny any focal numbness or weakness or chest pain.   Relevant past medical, surgical, family and social history reviewed and updated as indicated. Interim medical history since our last visit reviewed. Allergies and medications reviewed and updated.  Review of Systems  Constitutional: Negative for chills and fever.  Eyes: Negative for visual disturbance.  Respiratory: Negative for shortness of breath and wheezing.   Cardiovascular: Negative for chest pain and leg swelling.  Musculoskeletal: Negative for back pain and gait problem.  Skin: Negative for rash.  Neurological: Negative for dizziness, weakness and numbness.  All other systems reviewed and are negative.   Per HPI unless specifically indicated above   Allergies as of 08/18/2020   No Known Allergies     Medication List       Accurate as of August 18, 2020  3:17 PM. If you have any questions, ask your nurse or doctor.        amLODipine 10 MG tablet Commonly known as: NORVASC Take 1 tablet (10 mg total) by mouth daily.   aspirin EC 81 MG tablet Take 1 tablet (81 mg total) by mouth daily.    atorvastatin 40 MG tablet Commonly known as: LIPITOR TAKE 1 TABLET BY MOUTH ONCE DAILY AT  6  PM   lisinopril 40 MG tablet Commonly known as: ZESTRIL Take 1 tablet (40 mg total) by mouth daily.   meclizine 25 MG tablet Commonly known as: ANTIVERT Take 1 tablet (25 mg total) by mouth 3 (three) times daily as needed for dizziness.   metoprolol tartrate 100 MG tablet Commonly known as: LOPRESSOR Take 1 tablet (100 mg total) by mouth 2 (two) times daily.   mupirocin ointment 2 % Commonly known as: Bactroban Place 1 application into the nose 2 (two) times daily.        Objective:   Ht '6\' 1"'  (1.854 m)   Wt (!) 304 lb (137.9 kg)   BMI 40.11 kg/m   Wt Readings from Last 3 Encounters:  08/18/20 (!) 304 lb (137.9 kg)  04/21/20 300 lb (136.1 kg)  02/07/20 299 lb (135.6 kg)    Physical Exam Vitals and nursing note reviewed.  Constitutional:      General: He is not in acute distress.    Appearance: He is well-developed and well-nourished. He is not diaphoretic.  Eyes:     General: No scleral icterus.    Extraocular Movements: EOM normal.     Conjunctiva/sclera: Conjunctivae normal.  Neck:     Thyroid: No thyromegaly.  Cardiovascular:     Rate and Rhythm: Normal rate and regular rhythm.  Pulses: Intact distal pulses.     Heart sounds: Normal heart sounds. No murmur heard.   Pulmonary:     Effort: Pulmonary effort is normal. No respiratory distress.     Breath sounds: Normal breath sounds. No wheezing.  Musculoskeletal:        General: No edema. Normal range of motion.     Cervical back: Neck supple.  Lymphadenopathy:     Cervical: No cervical adenopathy.  Skin:    General: Skin is warm and dry.     Findings: No rash.  Neurological:     Mental Status: He is alert and oriented to person, place, and time.     Coordination: Coordination normal.  Psychiatric:        Mood and Affect: Mood and affect normal.        Behavior: Behavior normal.       Assessment  & Plan:   Problem List Items Addressed This Visit      Cardiovascular and Mediastinum   Essential hypertension (Chronic)   Relevant Medications   amLODipine (NORVASC) 10 MG tablet   atorvastatin (LIPITOR) 40 MG tablet   lisinopril (ZESTRIL) 40 MG tablet   metoprolol tartrate (LOPRESSOR) 100 MG tablet   Other Relevant Orders   CBC with Differential/Platelet   CMP14+EGFR     Other   Dyslipidemia, goal LDL below 70 (Chronic)   Relevant Medications   amLODipine (NORVASC) 10 MG tablet   atorvastatin (LIPITOR) 40 MG tablet   lisinopril (ZESTRIL) 40 MG tablet   metoprolol tartrate (LOPRESSOR) 100 MG tablet   Other Relevant Orders   Lipid panel      Hypertension and dyslipidemia recheck, seems to be doing well, continue current medication, has follow-up with vascular doctor for aortic dissection in the future after the repair.  They will rescan this October and 9 months from now. Follow up plan: Return in about 6 months (around 02/15/2021), or if symptoms worsen or fail to improve, for Hypertension dyslipidemia.  Counseling provided for all of the vaccine components No orders of the defined types were placed in this encounter.   Caryl Pina, MD [facility] 08/18/2020, 3:17 PM

## 2020-09-12 ENCOUNTER — Encounter: Payer: Self-pay | Admitting: *Deleted

## 2021-01-02 IMAGING — DX DG CHEST 2V
2 series · 2 of 2 positions shown · non-contrast
Comparison: Radiographs 03/30/2019 and 03/27/2019.

CLINICAL DATA: Dizziness post aortic surgery 2 weeks ago. History
of hypertension.

EXAM:
CHEST - 2 VIEW

[chest pa]
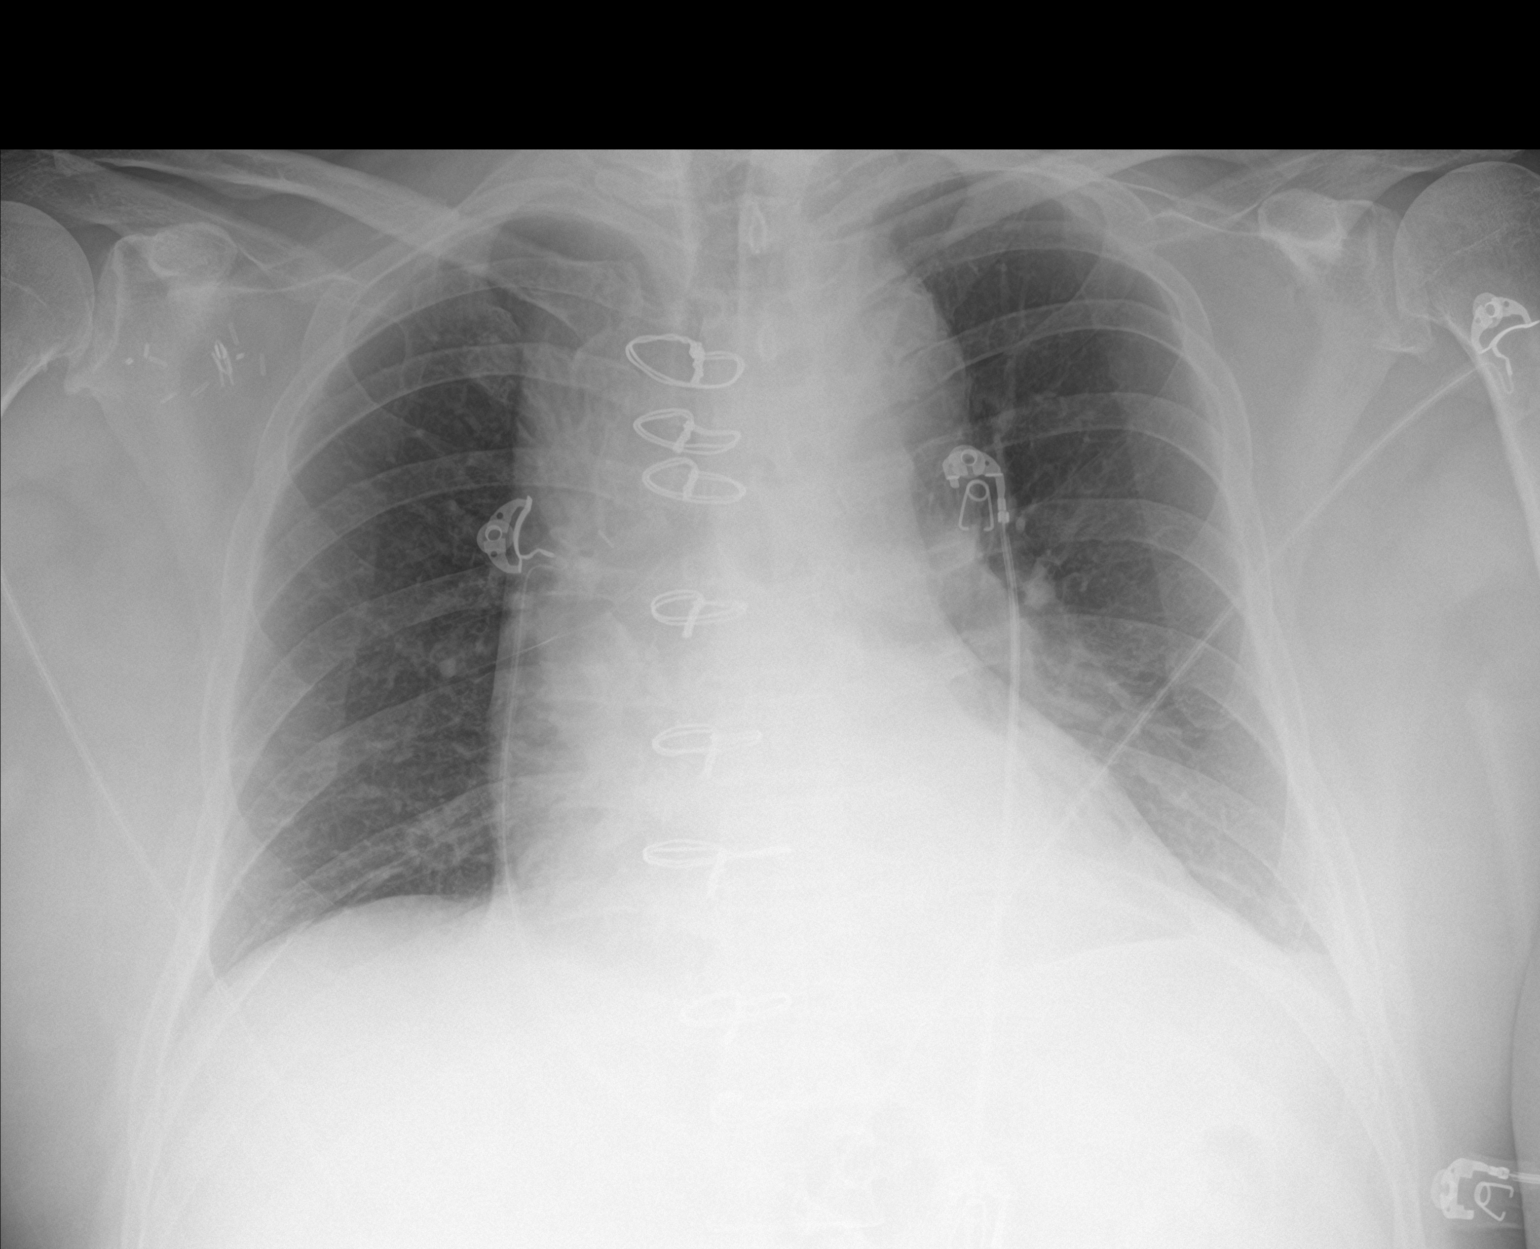

[chest lat]
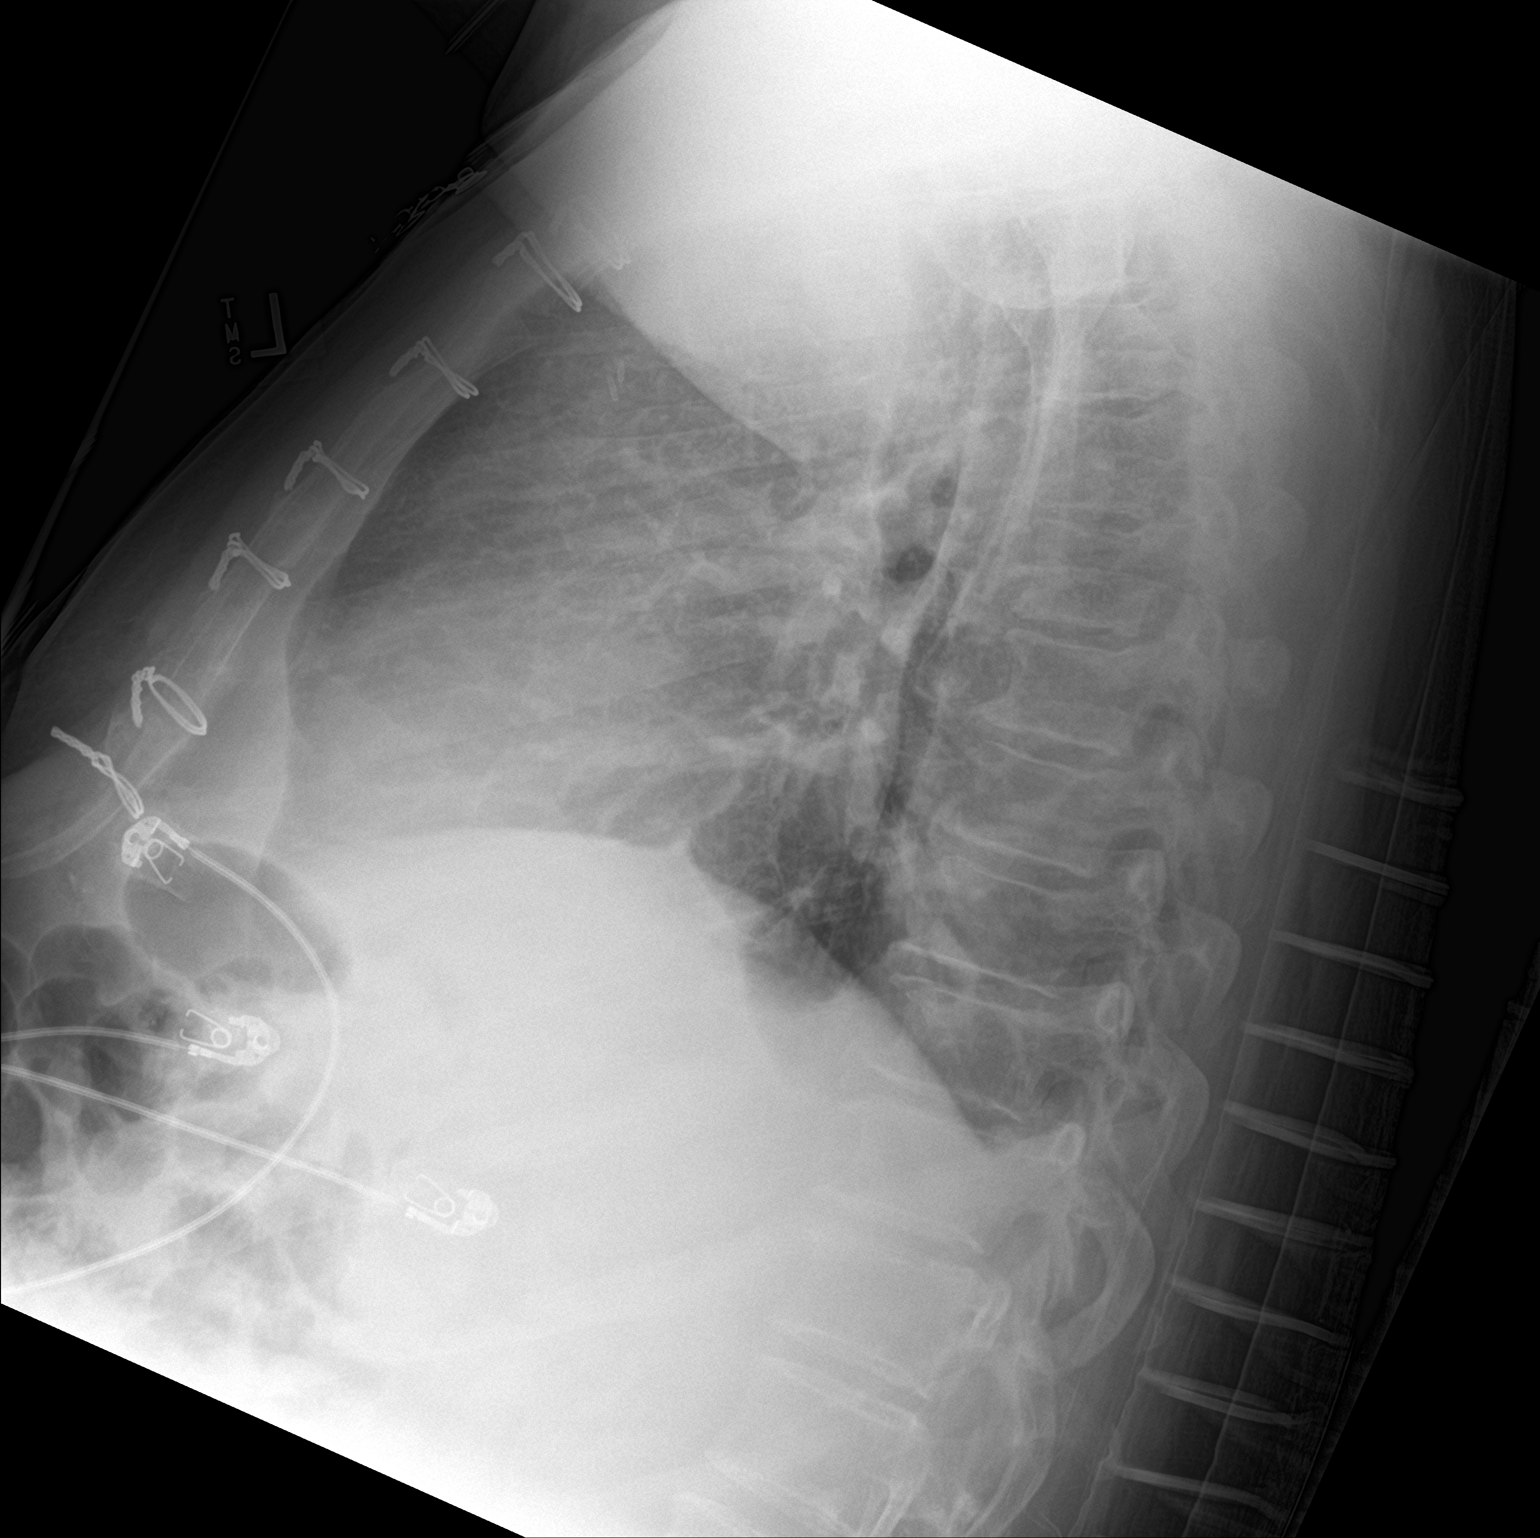

[2 of 2 positions shown; findings below may reference images not displayed]

FINDINGS: The heart size and mediastinal contours are stable post median
sternotomy. There is stable superior mediastinal widening. Small
bilateral pleural effusions and bibasilar atelectasis have improved.
There is no edema or pneumothorax. The bones appear unchanged.
IMPRESSION: Interval improved bilateral pleural effusions and bibasilar
aeration. Otherwise stable postoperative chest without evidence of
acute process.

## 2021-02-16 ENCOUNTER — Ambulatory Visit: Payer: Self-pay | Admitting: Family Medicine

## 2021-02-19 ENCOUNTER — Ambulatory Visit (INDEPENDENT_AMBULATORY_CARE_PROVIDER_SITE_OTHER): Payer: Self-pay | Admitting: Family Medicine

## 2021-02-19 ENCOUNTER — Other Ambulatory Visit: Payer: Self-pay

## 2021-02-19 ENCOUNTER — Encounter: Payer: Self-pay | Admitting: Family Medicine

## 2021-02-19 DIAGNOSIS — E785 Hyperlipidemia, unspecified: Secondary | ICD-10-CM

## 2021-02-19 DIAGNOSIS — I10 Essential (primary) hypertension: Secondary | ICD-10-CM

## 2021-02-19 DIAGNOSIS — Z5320 Procedure and treatment not carried out because of patient's decision for unspecified reasons: Secondary | ICD-10-CM

## 2021-02-19 MED ORDER — AMLODIPINE BESYLATE 10 MG PO TABS: 10.0000 mg | ORAL_TABLET | Freq: Every day | ORAL | 3 refills | Status: DC

## 2021-02-19 MED ORDER — ATORVASTATIN CALCIUM 40 MG PO TABS: ORAL_TABLET | ORAL | 3 refills | Status: DC

## 2021-02-19 MED ORDER — METOPROLOL TARTRATE 100 MG PO TABS: 100.0000 mg | ORAL_TABLET | Freq: Two times a day (BID) | ORAL | 3 refills | Status: DC

## 2021-02-19 MED ORDER — LISINOPRIL 40 MG PO TABS: 40.0000 mg | ORAL_TABLET | Freq: Every day | ORAL | 3 refills | Status: DC

## 2021-02-19 NOTE — Progress Notes (Signed)
Virtual Visit via telephone Note  I connected with Jeffrey Flynn on 02/19/21 at 0835 by telephone and verified that I am speaking with the correct person using two identifiers. Jeffrey Flynn is currently located at home and patient are currently with her during visit. The provider, Fransisca Kaufmann Jaylon Grode, MD is located in their office at time of visit.  Call ended at [phone]  I discussed the limitations, risks, security and privacy concerns of performing an evaluation and management service by telephone and the availability of in person appointments. I also discussed with the patient that there may be a patient responsible charge related to this service. The patient expressed understanding and agreed to proceed.   History and Present Illness: Hypertension Patient is currently on amlodipine and metoprolol and lisinopril, and their blood pressure today is unknown, he does not have a way to check it regularly. Patient denies any lightheadedness or dizziness. Patient denies headaches, blurred vision, chest pains, shortness of breath, or weakness. Denies any side effects from medication and is content with current medication.   Hyperlipidemia Patient is coming in for recheck of his hyperlipidemia. The patient is currently taking atorvastatin. They deny any issues with myalgias or history of liver damage from it. They deny any focal numbness or weakness or chest pain.   Patient has a sinus and cough allergies that are mild and he deals with.  He says this is not unusual and just uses over-the-counter medicines but that is why he was unable to be seen in person today  No diagnosis found.  Outpatient Encounter Medications as of 02/19/2021  Medication Sig   amLODipine (NORVASC) 10 MG tablet Take 1 tablet (10 mg total) by mouth daily.   aspirin EC 81 MG tablet Take 1 tablet (81 mg total) by mouth daily.   atorvastatin (LIPITOR) 40 MG tablet TAKE 1 TABLET BY MOUTH ONCE DAILY AT  6  PM   lisinopril (ZESTRIL) 40  MG tablet Take 1 tablet (40 mg total) by mouth daily.   meclizine (ANTIVERT) 25 MG tablet Take 1 tablet (25 mg total) by mouth 3 (three) times daily as needed for dizziness.   metoprolol tartrate (LOPRESSOR) 100 MG tablet Take 1 tablet (100 mg total) by mouth 2 (two) times daily.   mupirocin ointment (BACTROBAN) 2 % Place 1 application into the nose 2 (two) times daily.   No facility-administered encounter medications on file as of 02/19/2021.    Review of Systems  Constitutional:  Negative for chills and fever.  Eyes:  Negative for visual disturbance.  Respiratory:  Negative for shortness of breath and wheezing.   Cardiovascular:  Negative for chest pain and leg swelling.  Musculoskeletal:  Negative for back pain and gait problem.  Skin:  Negative for rash.  Neurological:  Negative for dizziness, weakness, light-headedness and headaches.  All other systems reviewed and are negative.  Observations/Objective: Patient sounds comfortable and in no acute distress  Assessment and Plan: Problem List Items Addressed This Visit       Cardiovascular and Mediastinum   Essential hypertension - Primary (Chronic)   Relevant Medications   amLODipine (NORVASC) 10 MG tablet   lisinopril (ZESTRIL) 40 MG tablet   metoprolol tartrate (LOPRESSOR) 100 MG tablet   atorvastatin (LIPITOR) 40 MG tablet   Other Relevant Orders   CBC with Differential/Platelet   CMP14+EGFR     Other   Dyslipidemia, goal LDL below 70 (Chronic)   Relevant Medications   amLODipine (NORVASC) 10 MG tablet  lisinopril (ZESTRIL) 40 MG tablet   metoprolol tartrate (LOPRESSOR) 100 MG tablet   atorvastatin (LIPITOR) 40 MG tablet   Other Relevant Orders   Lipid panel   Other Visit Diagnoses     Colon cancer screening declined           Continue current medication, will come in and do blood work Follow up plan: Return in about 1 year (around 02/19/2022), or if symptoms worsen or fail to improve, for htn and hld.      I discussed the assessment and treatment plan with the patient. The patient was provided an opportunity to ask questions and all were answered. The patient agreed with the plan and demonstrated an understanding of the instructions.   The patient was advised to call back or seek an in-person evaluation if the symptoms worsen or if the condition fails to improve as anticipated.  The above assessment and management plan was discussed with the patient. The patient verbalized understanding of and has agreed to the management plan. Patient is aware to call the clinic if symptoms persist or worsen. Patient is aware when to return to the clinic for a follow-up visit. Patient educated on when it is appropriate to go to the emergency department.    I provided 8 minutes of non-face-to-face time during this encounter.    Worthy Rancher, MD

## 2021-04-02 ENCOUNTER — Other Ambulatory Visit: Payer: Self-pay | Admitting: *Deleted

## 2021-04-02 DIAGNOSIS — I71019 Dissection of thoracic aorta, unspecified: Secondary | ICD-10-CM

## 2021-04-02 DIAGNOSIS — I7101 Dissection of thoracic aorta: Secondary | ICD-10-CM

## 2021-05-03 ENCOUNTER — Other Ambulatory Visit: Payer: Self-pay

## 2021-05-03 ENCOUNTER — Ambulatory Visit
Admission: RE | Admit: 2021-05-03 | Discharge: 2021-05-03 | Disposition: A | Discharge status: Home / Self Care | Payer: Self-pay | Source: Ambulatory Visit | Attending: Thoracic Surgery (Cardiothoracic Vascular Surgery) | Admitting: Thoracic Surgery (Cardiothoracic Vascular Surgery)

## 2021-05-03 ENCOUNTER — Ambulatory Visit (INDEPENDENT_AMBULATORY_CARE_PROVIDER_SITE_OTHER): Payer: No Typology Code available for payment source | Admitting: Physician Assistant

## 2021-05-03 ENCOUNTER — Encounter: Payer: Self-pay | Admitting: Physician Assistant

## 2021-05-03 VITALS — BP 137/72 | HR 55 | Resp 20 | Wt 297.0 lb

## 2021-05-03 DIAGNOSIS — I71019 Dissection of thoracic aorta, unspecified: Secondary | ICD-10-CM

## 2021-05-03 MED ORDER — IOPAMIDOL (ISOVUE-370) INJECTION 76%
75.0000 mL | Freq: Once | INTRAVENOUS | Status: AC | PRN
  Administered 2021-05-03: 75 mL via INTRAVENOUS

## 2021-05-03 NOTE — Progress Notes (Signed)
[address]       [place] [address]             [phone]                    Jeffrey Flynn [facility] Medical Record #[id number] Date of Birth: Sep 26, 1960  Referring: Terrilee Files, MD Primary Care: Sonny Masters, FNP (Inactive) Primary Cardiologist: Bryan Lemma, MD  Chief Complaint:    Chief Complaint  Patient presents with   Aortic Stenosis    1 year f/u with Chest CTA    History of Present Illness:    Jeffrey Flynn 60 y.o. male comes in for his 1 year surveillance CTA.  He underwent a dissection repair in September 2020.  This hospitalization was uncomplicated.   He has been checking his blood pressure at home and his systolics are in the 120s to 130s.   Today, he has no complaints.  He denies any chest pain or shortness of breath.  He does not have any issues with pain in his feet, numbness, or loss of sensation or feeling.  His extremities are warm and well-perfused.    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 Cliffton Asters)    Past Surgical History:  Procedure Laterality Date   Carotid Dopplers  09/2019   bilateral cervical carotids with no dissection or significant atherosclerosis.  Antegrade flow in left vertebral artery.  Right vertebral artery has bidirectional flow and small caliber.  Bilateral scalene arteries are stenotic with flow disturbance.  There is only a 12 mm pressure difference in both arms   EYE SURGERY Bilateral    cataracts    THORACIC AORTIC ANEURYSM REPAIR N/A 03/24/2019   Procedure: THORACIC ASCENDING ANEURYSM REPAIR (AAA);  Surgeon: Corliss Skains, MD;  Location: [facility] OR;  Service: Open Heart Surgery;  Laterality: N/A;   TRANSESOPHAGEAL ECHOCARDIOGRAM  03/24/2019   Intra-OP: EF 55-50%. No RWMA.  Non-concentric LVH.   Pre-surgical: Ascending Aorta noted dissection present w/ mobilization flap of Aortic Root, NOT involving Sinus of Valsalva. 70% of cross-sectional area of descending TAA. POST-REPAIR: NO AI (normal AO-Valve)   TRANSTHORACIC ECHOCARDIOGRAM  09/2019   Post aortic root replacement with valve sparing: EF 55 to 60%.  There is inferior basal hypokinesis but otherwise normal wall motion.  Normal diastolic parameters.  Normal right heart with mild RA dilation.  Aortic valve annular thickening noted from resuspension of aortic valve.  No regurgitation noted.  Turbulent flow noted in the arch post anastomosis.    Family History  Problem Relation Age of Onset   Hypertension Mother    Heart attack Mother    Bone cancer Father    Ovarian cancer Sister    Stroke Brother    Heart attack Maternal Grandmother    Prostate cancer Maternal Grandfather    Diabetes type II Paternal Grandfather    Cancer Brother        lung   Neuropathy Brother    Cancer Brother        unknown   Hypertension Brother    Hypertension Brother    Hypertension Sister    Hypertension Sister    Hernia Sister    Hypertension Sister  Hypertension Sister    Diabetes Son        amputee     Social History   Tobacco Use  Smoking Status Former Smoker   Packs/day: 1.00   Years: 5.00   Pack years: 5.00   Types: Cigarettes, Cigars   Quit date: 03/26/2009   Years since quitting: 11.0  Smokeless Tobacco Never Used    Social History   Substance and Sexual Activity  Alcohol Use Never     No Known Allergies  Current Outpatient Medications on File Prior to Visit  Medication Sig Dispense Refill   amLODipine (NORVASC) 10 MG tablet Take 1 tablet (10 mg total) by mouth daily. 90 tablet 3   aspirin EC 81 MG tablet Take 1 tablet (81 mg total) by mouth daily. 90 tablet 3   atorvastatin (LIPITOR) 40 MG tablet TAKE 1 TABLET BY MOUTH ONCE DAILY AT  6  PM 90 tablet 3   lisinopril (ZESTRIL) 40 MG tablet Take 1 tablet (40 mg total)  by mouth daily. 90 tablet 3   metoprolol tartrate (LOPRESSOR) 100 MG tablet Take 1 tablet (100 mg total) by mouth 2 (two) times daily. 90 tablet 3   mupirocin ointment (BACTROBAN) 2 % Place 1 application into the nose 2 (two) times daily. 22 g 0   No current facility-administered medications on file prior to visit.      Review of Systems  Constitutional: Negative.   HENT: Negative.   Respiratory: Negative.   Cardiovascular: Negative.      PHYSICAL EXAMINATION: Vitals:   05/03/21 1300  BP: 137/72  Pulse: (!) 55  Resp: 20  SpO2: 95%     Physical Exam  Cor: RRR, no murmur Neck: No JVD or carotid bruit Pulm: CTA bilaterally and in all fields Abdomen: No tenderness Wound: Well-healed, clean and dry Extremities: Warm, well perfused.  Good peripheral pulses.     Diagnostic Studies & Laboratory data:   CLINICAL DATA:  60 year old male with a history of type a dissection, treated 03/25/2019 with hemi arch repair and native valve resuspension   EXAM: CT ANGIOGRAPHY CHEST WITH CONTRAST   TECHNIQUE: Multidetector CT imaging of the chest was performed using the standard protocol during bolus administration of intravenous contrast. Multiplanar CT image reconstructions and MIPs were obtained to evaluate the vascular anatomy.   CONTRAST:  76mL ISOVUE-370 IOPAMIDOL (ISOVUE-370) INJECTION 76%   COMPARISON:  Baseline 03/24/2019, 04/09/2019, 04/21/2020   FINDINGS: Cardiovascular:   Heart:   No cardiomegaly. No pericardial fluid/thickening. Calcifications of left main, left anterior descending, circumflex, right coronary arteries.   Aorta:   Minimal aortic valve calcifications.   Surgical changes of median sternotomy, hemi arch repair of type a dissection, with native valve resuspension. Proximal anastomosis at the sinotubular junction unremarkable.   The distal anastomosis is unremarkable, just proximal to the innominate artery origin. There is fairly uniform  perfusion of the false lumen and the true lumen.   Estimated greatest diameter of the proximal aortic arch 47 mm, previously on the baseline CT 44 mm.   There is relatively uniform perfusion of the branch arteries. The timing of the contrast bolus somewhat limits evaluation of the cervical cerebral vessels, however these appear patent.   The dissection flap again terminates within the proximal right subclavian artery.   Dissection flap appears to terminate within the proximal left subclavian artery.   There is differential contrast enhancement/perfusion of the true lumen and the false lumen within the distal thoracic aorta, with the false lumen  having a preferential enhancement pattern. Dissection flap continues below the lowest extent of the exam into the abdomen.   Greatest diameter of the proximal descending thoracic aorta 5.3 cm, which measured 4.3 cm on the baseline CT of 03/24/2019.   No periaortic fluid or inflammatory changes.   Pulmonary arteries:   Timing of the contrast bolus is not optimized for evaluation of pulmonary artery filling defects.   Mediastinum/Nodes: Surgical changes of median sternotomy. No adenopathy.   Unremarkable appearance of the thoracic esophagus.   Lungs/Pleura: Central airways are clear. No pleural effusion. No confluent airspace disease.   No pneumothorax.   Similar appearance of questionable 3 mm nodule of the left lower lobe, with no new or increasing size of lung nodules.   Upper Abdomen: No acute.   Musculoskeletal: No acute finding of the upper abdomen. Similar appearance of the cyst superior left kidney.   Review of the MIP images confirms the above findings.   IMPRESSION:  Redemonstration postsurgical changes for repair of type A dissection, including hemi arch repair, resuspension of the aortic valve. 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 There is preferential perfusion of the false channel on the CT scan.   Similar appearance of questionable left lower lobe lung nodule. Attention on future follow-up studies recommended.   Signed,   Yvone Neu. Reyne Dumas, RPVI   [facility]   [facility]     Electronically Signed   By: Gilmer Mor D.O.   On: 05/03/2021 12:59       Recent Radiology Findings:   CT ANGIO CHEST AORTA W/CM & OR WO/CM  Result Date: 04/21/2020 CLINICAL DATA:  60 year old with history of type A aortic dissection. Status post surgical repair. EXAM: CT ANGIOGRAPHY CHEST WITH CONTRAST TECHNIQUE: Multidetector CT imaging of the chest was performed using the standard protocol during bolus administration of intravenous contrast. Multiplanar CT image reconstructions and MIPs were obtained to evaluate the vascular anatomy. CONTRAST:  11mL ISOVUE-370 IOPAMIDOL (ISOVUE-370) INJECTION 76% COMPARISON:  04/09/2019 and 03/24/2019 FINDINGS: Cardiovascular: Again noted is repair of the ascending thoracic aorta with a surgical graft. The surgical graft is patent. Proximal graft anastomosis is near the sinotubular junction. The native aortic root measures roughly 4.0 cm and stable. Main left coronary artery and right coronary artery are patent. Again noted is a dissection involving the aortic arch and descending thoracic aorta. Dissection involves the innominate artery. The innominate artery, right common carotid artery and right subclavian artery are patent. Proximal right vertebral artery is patent but small. The left common carotid artery and left subclavian artery originate off the true lumen. The left common carotid artery and left subclavian artery are patent. Proximal left vertebral artery is patent. Postsurgical changes at the right axilla. Aortic arch dissection measures up to 4.6 cm and similar to  the prior examination. Proximal descending thoracic aorta has slightly enlarged measuring 5.0 cm and previously measuring 4.6 cm. The true lumen is along is along the anterior aspect of the descending thoracic aorta and smaller than the false lumen. Mid descending thoracic aorta measures 4.9 cm and previously measured 4.4 cm. Distal descending thoracic aorta measures 3.9 cm and previously measured 3.8 cm. No fluid or inflammatory changes around the descending thoracic aorta. Small amount of mural thrombus along the right side of the dissection flap in the proximal descending thoracic  aorta, best seen on sequence 12, image 51. Normal caliber of the main pulmonary arteries. Limited evaluation of the pulmonary arteries due to the phase of contrast. Heart size is normal without significant pericardial fluid. Aortic dissection extends into the abdominal aorta. Celiac trunk and SMA are patent and originate from the true lumen. Limited evaluation of the renal arteries on this examination. Aorta just above the celiac trunk measures up to 3.7 cm in AP dimension and previously measured 3.1 cm. Aorta at the level of the renal arteries measures 3.1 cm and previously measured 2.9 cm. Mediastinum/Nodes: Visualized thyroid tissue is unremarkable. No significant lymph node enlargement in the mediastinum, hila or axillary regions. Negative for mediastinal hematoma. Lungs/Pleura: No large pleural effusions. Small amount of material along the right side of the trachea on sequence 14, image 21 likely represents adherent mucus. Trachea and mainstem bronchi are patent. Stable focal nodularity along the right major fissure on sequence 14, image 86. Stable triangular-shaped density near the right middle lobe on sequence 14, image 93 and measures roughly 5 mm. Stable thickening or focal nodularity involving the left major fissure on sequence 14, image 84. Question a new 3 mm peripheral nodule in the left lower lobe on sequence 14, image 89.  No significant airspace disease or lung consolidation. Stable elongated nodular density in the left lower lobe measuring 8 mm on sequence 14, image 116. Upper Abdomen: Images of the upper abdomen are unremarkable. Musculoskeletal: No acute bone abnormality. Median sternotomy wires. Review of the MIP images confirms the above findings. IMPRESSION: 1. Surgical repair of the ascending thoracic aorta for aortic dissection. Stable appearance of the surgical graft without complicating features. 2. Chronic aortic dissection involving the aortic arch, descending thoracic aorta and visualized abdominal aorta. Interval enlargement of the descending thoracic aorta and proximal abdominal aorta. Proximal descending thoracic aorta now measures 5.0 cm and previously measured 4.6 cm. Proximal abdominal aorta now measures 3.7 cm and previously measured 3.1 cm. 3. Scattered small pulmonary nodules. Majority of these pulmonary nodules are unchanged and likely benign. Question of a new 3 mm nodule in the left lower lobe. Recommend attention to this area on follow up imaging. Electronically Signed   By: Richarda Overlie M.D.   On: 04/21/2020 09:06       I have independently reviewed the above radiology studies  and reviewed the findings with the patient.   Recent Lab Findings: Lab Results  Component Value Date   WBC 8.1 05/12/2019   HGB 14.3 05/12/2019   HCT 43.4 05/12/2019   PLT 290 05/12/2019   GLUCOSE 105 (H) 10/04/2019   CHOL 96 (L) 10/04/2019   TRIG 89 10/04/2019   HDL 30 (L) 10/04/2019   LDLCALC 48 10/04/2019   ALT 21 10/04/2019   AST 24 10/04/2019   NA 142 10/04/2019   K 4.4 10/04/2019   CL 105 10/04/2019   CREATININE 1.06 10/04/2019   BUN 17 10/04/2019   CO2 22 10/04/2019   TSH 1.160 05/12/2019   INR 1.5 (H) 03/25/2019   HGBA1C 5.6 04/09/2019        Assessment / Plan:    60 year old male status post type A aortic dissection repair with hemiarch.  I personally reviewed his CTA.  Repair remained  stable.  He has a stable descending thoracic aortic aneurysm however on CT scan it shows preferential perfusion of the false channel.  Also, his proximal descending thoracic aortic aneurysm was measured at 5.0 cm last year and has increased to 5.3  cm.   For this reason, I have reached out to Dr. Cliffton Asters to please review the CT scan from today.  We have also scheduled an appointment for tomorrow with Dr. Cliffton Asters.  He does work nights so he is planning on going to his night shift as usual tonight.  Obviously, if he develops any severe chest pain or symptoms while at work he needs to report to the emergency room.  There is a similar appearance of a questionable lower left lung nodule as well.   Echocardiogram checked March 2021 which showed an estimated ejection fraction of 55 to 60%.  There was some mild reduction of function in the inferior wall.  The dissection repair seems to be stable here with the aortic valve suspended.  The aortic valve is working well without any note of aortic regurgitation. He sees Dr. Herbie Baltimore for his cardiology needs.   Plan: Follow-up with Dr. Cliffton Asters tomorrow.  He has been notified of the CT scan results.  He may call the patient with further instructions.  As always, I have encouraged the patient to maintain tight blood pressure control and to not engage in any heavy lifting.   Jari Favre, PA-C 05/03/2021

## 2021-05-04 ENCOUNTER — Encounter: Payer: Self-pay | Admitting: Thoracic Surgery (Cardiothoracic Vascular Surgery)

## 2021-05-04 ENCOUNTER — Ambulatory Visit (INDEPENDENT_AMBULATORY_CARE_PROVIDER_SITE_OTHER): Payer: Self-pay | Admitting: Thoracic Surgery (Cardiothoracic Vascular Surgery)

## 2021-05-04 DIAGNOSIS — I7101 Dissection of ascending aorta: Secondary | ICD-10-CM

## 2021-05-04 NOTE — Progress Notes (Signed)
     [address]       [place] [address]             [phone]       Patient: Home Provider: Office Consent for Telemedicine visit obtained.  Today's visit was completed via a real-time telehealth (see specific modality noted below). The patient/authorized person provided oral consent at the time of the visit to engage in a telemedicine encounter with the present provider at [facility]. The patient/authorized person was informed of the potential benefits, limitations, and risks of telemedicine. The patient/authorized person expressed understanding that the laws that protect confidentiality also apply to telemedicine. The patient/authorized person acknowledged understanding that telemedicine does not provide emergency services and that he or she would need to call 911 or proceed to the nearest hospital for help if such a need arose.   Total time spent in the clinical discussion 10 minutes.  Telehealth Modality: Phone visit (audio only)  I had a telephone visit with Mr. Harps.  He is status post type a dissection repair in 2020.  There was some questions about his visit yesterday PA clinic.  There has been enlargement and his descending thoracic aorta from 4.3 cm to 5.3 cm of the course of the year.  Does not quite meet size criteria for intervention.  I will refer him to vascular surgery for ongoing surveillance as well.  I will see him back in 1 year with a CTA of the chest for management of his ascending aorta.

## 2021-05-13 NOTE — Progress Notes (Deleted)
[facility] [place]  ASSESSMENT / PLAN: 60 y.o. male status post type A aortic dissection repair with hemi-arch reconstruction 03/25/19 by Dr. Cliffton Asters. 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 The descending thoracic aorta now measures 87mm.   The patient will need continued surveillance of chronic dissection with aneurysmal degeneration. I would recommend intervention when the aorta reaches 48mm to reduce the risk of rupture. This would require zone 0 TEVAR with aortic arch debranching through a re-do sternotomy.   CHIEF COMPLAINT: ***  HISTORY OF PRESENT ILLNESS: Jeffrey Flynn is a 60 y.o. male ***  VASCULAR SURGICAL HISTORY: ***  VASCULAR RISK FACTORS: {FINDINGS; POSITIVE NEGATIVE:586-250-4051} history of stroke / transient ischemic attack. {FINDINGS; POSITIVE NEGATIVE:586-250-4051} history of coronary artery disease. *** history of PCI. *** history of CABG.  {FINDINGS; POSITIVE NEGATIVE:586-250-4051} history of diabetes mellitus. Last A1c ***. {FINDINGS; POSITIVE NEGATIVE:586-250-4051} history of smoking. *** actively smoking. {FINDINGS; POSITIVE NEGATIVE:586-250-4051} history of hypertension. *** drug regimen with *** control. {FINDINGS; POSITIVE NEGATIVE:586-250-4051} history of chronic kidney disease.  Last GFR ***. CKD {stage:30421363}. {FINDINGS; POSITIVE NEGATIVE:586-250-4051} history of chronic obstructive pulmonary disease, treated with ***.  FUNCTIONAL STATUS: ECOG performance status: {findings; ecog performance status:31780} Ambulatory status: {TNHAmbulation:25868}  Past Medical History:  Diagnosis Date   Cataract    Dysphagia    Status post endoscopic dilation   History of pneumothorax    Hypertension    Thoracic aortic dissection  - TYPE A 03/24/2019   s/p Open Repair - 28 mm  Hemashield graft (Dr. Cliffton Asters)    Past Surgical History:  Procedure Laterality Date   Carotid Dopplers  09/2019   bilateral cervical carotids with no dissection or significant atherosclerosis.  Antegrade flow in left vertebral artery.  Right vertebral artery has bidirectional flow and small caliber.  Bilateral scalene arteries are stenotic with flow disturbance.  There is only a 12 mm pressure difference in both arms   EYE SURGERY Bilateral    cataracts    THORACIC AORTIC ANEURYSM REPAIR N/A 03/24/2019   Procedure: THORACIC ASCENDING ANEURYSM REPAIR (AAA);  Surgeon: Corliss Skains, MD;  Location: [facility] OR;  Service: Open Heart Surgery;  Laterality: N/A;   TRANSESOPHAGEAL ECHOCARDIOGRAM  03/24/2019   Intra-OP: EF 55-50%. No RWMA.  Non-concentric LVH.  Pre-surgical: Ascending Aorta noted dissection present w/ mobilization flap of Aortic Root, NOT involving Sinus of Valsalva. 70% of cross-sectional area of descending TAA. POST-REPAIR: NO AI (normal AO-Valve)   TRANSTHORACIC ECHOCARDIOGRAM  09/2019   Post aortic root replacement with valve sparing: EF 55 to 60%.  There is inferior basal hypokinesis but otherwise normal wall motion.  Normal diastolic parameters.  Normal right heart with mild RA dilation.  Aortic valve annular thickening noted from resuspension of aortic valve.  No regurgitation noted.  Turbulent flow noted in the arch post anastomosis.    Family History  Problem Relation Age of Onset   Hypertension Mother    Heart attack Mother    Bone cancer Father    Ovarian cancer Sister    Stroke Brother    Heart attack Maternal Grandmother    Prostate cancer Maternal Grandfather    Diabetes type II Paternal Grandfather    Cancer Brother        lung   Neuropathy Brother    Cancer Brother        unknown  Hypertension Brother    Hypertension Brother    Hypertension Sister    Hypertension Sister    Hernia Sister    Hypertension Sister    Hypertension Sister    Diabetes Son         amputee    Social History   Socioeconomic History   Marital status: Legally Separated    Spouse name: Not on file   Number of children: 2   Years of education: Not on file   Highest education level: Not on file  Occupational History   Occupation: unemployed  Tobacco Use   Smoking status: Former    Packs/day: 1.00    Years: 5.00    Pack years: 5.00    Types: Cigarettes, Cigars    Quit date: 03/26/2009    Years since quitting: 12.1   Smokeless tobacco: Never  Vaping Use   Vaping Use: Never used  Substance and Sexual Activity   Alcohol use: Never   Drug use: Never   Sexual activity: Not on file  Other Topics Concern   Not on file  Social History Narrative   Lives with son and daughter in law and their 4 children    Social Determinants of Health   Financial Resource Strain: Not on file  Food Insecurity: Not on file  Transportation Needs: Not on file  Physical Activity: Not on file  Stress: Not on file  Social Connections: Not on file  Intimate Partner Violence: Not on file    No Known Allergies  Current Outpatient Medications  Medication Sig Dispense Refill   amLODipine (NORVASC) 10 MG tablet Take 1 tablet (10 mg total) by mouth daily. 90 tablet 3   aspirin EC 81 MG tablet Take 1 tablet (81 mg total) by mouth daily. 90 tablet 3   atorvastatin (LIPITOR) 40 MG tablet TAKE 1 TABLET BY MOUTH ONCE DAILY AT  6  PM 90 tablet 3   lisinopril (ZESTRIL) 40 MG tablet Take 1 tablet (40 mg total) by mouth daily. 90 tablet 3   metoprolol tartrate (LOPRESSOR) 100 MG tablet Take 1 tablet (100 mg total) by mouth 2 (two) times daily. 90 tablet 3   mupirocin ointment (BACTROBAN) 2 % Place 1 application into the nose 2 (two) times daily. 22 g 0   No current facility-administered medications for this visit.    REVIEW OF SYSTEMS:  [X]  denotes positive finding, [ ]  denotes negative finding Cardiac  Comments:  Chest pain or chest pressure: ***   Shortness of breath upon exertion:     Short of breath when lying flat:    Irregular heart rhythm:        Vascular    Pain in calf, thigh, or hip brought on by ambulation:    Pain in feet at night that wakes you up from your sleep:     Blood clot in your veins:    Leg swelling:         Pulmonary    Oxygen at home:    Productive cough:     Wheezing:         Neurologic    Sudden weakness in arms or legs:     Sudden numbness in arms or legs:     Sudden onset of difficulty speaking or slurred speech:    Temporary loss of vision in one eye:     Problems with dizziness:         Gastrointestinal    Blood in stool:  Vomited blood:         Genitourinary    Burning when urinating:     Blood in urine:        Psychiatric    Major depression:         Hematologic    Bleeding problems:    Problems with blood clotting too easily:        Skin    Rashes or ulcers:        Constitutional    Fever or chills:      PHYSICAL EXAM There were no vitals filed for this visit.  Constitutional: *** appearing. *** distress. Appears *** nourished.  Neurologic: CN ***. *** focal findings. *** sensory loss. Psychiatric: *** Mood and affect symmetric and appropriate. Eyes: *** No icterus. No conjunctival pallor. Ears, nose, throat: *** mucous membranes moist. Midline trachea.  Cardiac: *** rate and rhythm.  Respiratory: *** unlabored. Abdominal: *** soft, non-tender, non-distended.  Peripheral vascular: *** Extremity: *** edema. *** cyanosis. *** pallor.  Skin: *** gangrene. *** ulceration.  Lymphatic: *** Stemmer's sign. *** palpable lymphadenopathy.  PERTINENT LABORATORY AND RADIOLOGIC DATA  Most recent CBC CBC Latest Ref Rng & Units 05/12/2019 04/09/2019 03/30/2019  WBC 3.4 - 10.8 x10E3/uL 8.1 14.0(H) 11.2(H)  Hemoglobin 13.0 - 17.7 g/dL 82.5 05.3 97.6  Hematocrit 37.5 - 51.0 % 43.4 41.2 41.9  Platelets 150 - 450 x10E3/uL 290 511(H) 247     Most recent CMP CMP Latest Ref Rng & Units 10/04/2019 05/12/2019 04/09/2019   Glucose 65 - 99 mg/dL 734(L) 94 937(T)  BUN 6 - 24 mg/dL 17 13 15   Creatinine 0.76 - 1.27 mg/dL 0.24 0.97  Sodium 134 - 144 mmol/L 142 142 138  Potassium 3.5 - 5.2 mmol/L 4.4 3.8 3.8  Chloride 96 - 106 mmol/L 105 102 101  CO2 20 - 29 mmol/L 22 24 26   Calcium 8.7 - 10.2 mg/dL 9.1 9.5 8.9  Total Protein 6.0 - 8.5 g/dL 6.6 6.3 3.53)  Total Bilirubin 0.0 - 1.2 mg/dL 0.6 0.3 0.4  Alkaline Phos 39 - 117 IU/L 92 104 93  AST 0 - 40 IU/L 24 13 20   ALT 0 - 44 IU/L 21 17 22     Renal function CrCl cannot be calculated (Patient's most recent lab result is older than the maximum 21 days allowed.).  Hgb A1c MFr Bld (%)  Date Value  04/09/2019 5.6    LDL Chol Calc (NIH)  Date Value Ref Range Status  10/04/2019 48 0 - 99 mg/dL Final     Vascular Imaging: ***  Daryel Kenneth N. , MD [facility] Phone Number: [phone] 05/13/2021 8:18 AM  Total time spent on preparing this encounter including chart review, data review, collecting history, examining the patient, coordinating care for this {tnhtimebilling:26202}  Portions of this report may have been transcribed using voice recognition software.  Every effort has been made to ensure accuracy; however, inadvertent computerized transcription errors may still be present.

## 2021-05-14 ENCOUNTER — Encounter: Payer: No Typology Code available for payment source | Admitting: Vascular Surgery

## 2021-05-23 ENCOUNTER — Encounter: Payer: Self-pay | Admitting: Vascular Surgery

## 2021-05-23 ENCOUNTER — Ambulatory Visit (INDEPENDENT_AMBULATORY_CARE_PROVIDER_SITE_OTHER): Payer: No Typology Code available for payment source | Admitting: Vascular Surgery

## 2021-05-23 ENCOUNTER — Other Ambulatory Visit: Payer: Self-pay

## 2021-05-23 VITALS — BP 122/68 | HR 56 | Temp 98.0°F | Resp 20 | Ht 73.0 in | Wt 295.0 lb

## 2021-05-23 DIAGNOSIS — I71019 Dissection of thoracic aorta, unspecified: Secondary | ICD-10-CM

## 2021-05-23 NOTE — Progress Notes (Signed)
Patient ID: Jeffrey Flynn, male   DOB: 1961/05/18, 60 y.o.   MRN: [id number]  Reason for Consult: New Patient (Initial Visit)   Referred by Dettinger, Fransisca Kaufmann, MD  Subjective:     HPI:  Jeffrey Flynn is a 60 y.o. male underwent acute type a dissection repair with a sending aortic repair and Hemi arch in September 2020.  He is subsequently been followed closely by cardiothoracic surgery with known type B component to his dissection.  He does not have any new chest or abdominal pain.  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  He is really free of complaints today.  He is a former smoker.  He does not have any personal or family history of aneurysm disease prior to this.  He does take aspirin and a statin.  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 Kipp Brood)   Family History  Problem Relation Age of Onset   Hypertension Mother    Heart attack Mother    Bone cancer Father    Ovarian cancer Sister    Stroke Brother    Heart attack Maternal Grandmother    Prostate cancer Maternal Grandfather    Diabetes type II Paternal Grandfather    Cancer Brother        lung   Neuropathy Brother    Cancer Brother        unknown   Hypertension Brother    Hypertension Brother    Hypertension Sister    Hypertension Sister    Hernia Sister    Hypertension Sister    Hypertension Sister    Diabetes Son        amputee   Past Surgical History:  Procedure Laterality Date   Carotid Dopplers  09/2019   bilateral cervical carotids with no dissection or significant atherosclerosis.  Antegrade flow in left vertebral artery.  Right vertebral artery has bidirectional flow and small caliber.  Bilateral scalene arteries are stenotic with flow disturbance.  There  is only a 12 mm pressure difference in both arms   EYE SURGERY Bilateral    cataracts    THORACIC AORTIC ANEURYSM REPAIR N/A 03/24/2019   Procedure: THORACIC ASCENDING ANEURYSM REPAIR (AAA);  Surgeon: Lajuana Matte, MD;  Location: [facility];  Service: Open Heart Surgery;  Laterality: N/A;   TRANSESOPHAGEAL ECHOCARDIOGRAM  03/24/2019   Intra-OP: EF 55-50%. No RWMA.  Non-concentric LVH.  Pre-surgical: Ascending Aorta noted dissection present w/ mobilization flap of Aortic Root, NOT involving Sinus of Valsalva. 70% of cross-sectional area of descending TAA. POST-REPAIR: NO AI (normal AO-Valve)   TRANSTHORACIC ECHOCARDIOGRAM  09/2019   Post aortic root replacement with valve sparing: EF 55 to 60%.  There is inferior basal hypokinesis but otherwise normal wall motion.  Normal diastolic parameters.  Normal right heart with mild RA dilation.  Aortic valve annular thickening noted from resuspension of aortic valve.  No regurgitation noted.  Turbulent flow noted in the arch post anastomosis.    Short Social History:  Social History   Tobacco Use   Smoking status: Former    Packs/day: 1.00    Years: 5.00    Pack years: 5.00    Types: Cigarettes, Cigars    Quit  date: 03/26/2009    Years since quitting: 12.1   Smokeless tobacco: Never  Substance Use Topics   Alcohol use: Never    No Known Allergies  Current Outpatient Medications  Medication Sig Dispense Refill   amLODipine (NORVASC) 10 MG tablet Take 1 tablet (10 mg total) by mouth daily. 90 tablet 3   aspirin EC 81 MG tablet Take 1 tablet (81 mg total) by mouth daily. 90 tablet 3   atorvastatin (LIPITOR) 40 MG tablet TAKE 1 TABLET BY MOUTH ONCE DAILY AT  6  PM 90 tablet 3   lisinopril (ZESTRIL) 40 MG tablet Take 1 tablet (40 mg total) by mouth daily. 90 tablet 3   metoprolol tartrate (LOPRESSOR) 100 MG tablet Take 1 tablet (100 mg total) by mouth 2 (two) times daily. 90 tablet 3   mupirocin ointment (BACTROBAN) 2 % Place 1 application into  the nose 2 (two) times daily. 22 g 0   No current facility-administered medications for this visit.    Review of Systems  Constitutional:  Constitutional negative. HENT: HENT negative.  Eyes: Eyes negative.  Respiratory: Respiratory negative.  Cardiovascular: Cardiovascular negative.  GI: Gastrointestinal negative.  Musculoskeletal: Musculoskeletal negative.  Skin: Skin negative.  Neurological: Neurological negative. Hematologic: Hematologic/lymphatic negative.  Psychiatric: Psychiatric negative.       Objective:  Objective   Vitals:   05/23/21 1424  BP: 122/68  Pulse: (!) 56  Resp: 20  Temp: 98 F (36.7 C)  SpO2: 95%  Weight: 295 lb (133.8 kg)  Height: '6\' 1"'  (1.854 m)   Body mass index is 38.92 kg/m.  Physical Exam HENT:     Head: Normocephalic.     Nose:     Comments: Wearing a mask Eyes:     Pupils: Pupils are equal, round, and reactive to light.  Cardiovascular:     Rate and Rhythm: Normal rate.     Pulses: Normal pulses.  Pulmonary:     Effort: Pulmonary effort is normal.  Abdominal:     General: Abdomen is flat.     Palpations: Abdomen is soft. There is no mass.  Musculoskeletal:        General: Normal range of motion.     Cervical back: Normal range of motion and neck supple.     Right lower leg: No edema.     Left lower leg: No edema.  Skin:    General: Skin is warm and dry.     Capillary Refill: Capillary refill takes less than 2 seconds.  Neurological:     General: No focal deficit present.     Mental Status: He is alert.  Psychiatric:        Mood and Affect: Mood normal.        Behavior: Behavior normal.        Thought Content: Thought content normal.        Judgment: Judgment normal.    Data: CT chest IMPRESSION: Redemonstration postsurgical changes for repair of type A dissection, including hemi arch repair, resuspension of the aortic valve. 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 There is preferential perfusion of the false channel on the CT scan.     Assessment/Plan:     60 year old male status post type a dissection repair with type B component that is enlarging down to 5.3 cm.  I discussed likely repair at 78  cm.  Does appear it would probably need to branching component with stenting and I reviewed the CT scan with the patient and his family today.  Unfortunately this was a CT of the chest we will get dissection protocol CT in 6 months for further review which will probably give [place] a better evaluation of the flap and surgery that would be required should the criteria be met.  All of his questions were answered.  We have discussed the need for urgent medical evaluation should he have recurrent chest or back pain.     Waynetta Sandy MD [facility]

## 2021-08-22 ENCOUNTER — Other Ambulatory Visit: Payer: Self-pay | Admitting: Family Medicine

## 2021-08-22 DIAGNOSIS — I10 Essential (primary) hypertension: Secondary | ICD-10-CM

## 2021-09-21 ENCOUNTER — Other Ambulatory Visit: Payer: Self-pay | Admitting: Family Medicine

## 2021-09-21 ENCOUNTER — Encounter: Payer: Self-pay | Admitting: Family Medicine

## 2021-09-21 DIAGNOSIS — I10 Essential (primary) hypertension: Secondary | ICD-10-CM

## 2021-09-21 NOTE — Telephone Encounter (Signed)
30 days given 08/22/2021 pt needs appt for refills ?

## 2021-09-21 NOTE — Telephone Encounter (Signed)
TRIED TO CALL PT ABOUT RX AND NO ANSWER. I SENT LETTER ?

## 2021-10-18 ENCOUNTER — Encounter: Payer: Self-pay | Admitting: Family Medicine

## 2021-10-18 ENCOUNTER — Ambulatory Visit (INDEPENDENT_AMBULATORY_CARE_PROVIDER_SITE_OTHER): Payer: Self-pay | Admitting: Family Medicine

## 2021-10-18 VITALS — BP 129/75 | HR 50 | Ht 73.0 in | Wt 298.0 lb

## 2021-10-18 DIAGNOSIS — E785 Hyperlipidemia, unspecified: Secondary | ICD-10-CM

## 2021-10-18 DIAGNOSIS — I10 Essential (primary) hypertension: Secondary | ICD-10-CM

## 2021-10-18 MED ORDER — LISINOPRIL 40 MG PO TABS: 40.0000 mg | ORAL_TABLET | Freq: Every day | ORAL | 3 refills | Status: DC

## 2021-10-18 MED ORDER — ATORVASTATIN CALCIUM 40 MG PO TABS: ORAL_TABLET | ORAL | 3 refills | Status: DC

## 2021-10-18 MED ORDER — AMLODIPINE BESYLATE 10 MG PO TABS: 10.0000 mg | ORAL_TABLET | Freq: Every day | ORAL | 3 refills | Status: DC

## 2021-10-18 MED ORDER — METOPROLOL TARTRATE 100 MG PO TABS: 100.0000 mg | ORAL_TABLET | Freq: Two times a day (BID) | ORAL | 3 refills | Status: DC

## 2021-10-18 NOTE — Progress Notes (Signed)
? ?BP 129/75   Pulse (!) 50   Ht '6\' 1"'  (1.854 m)   Wt 298 lb (135.2 kg)   SpO2 97%   BMI 39.32 kg/m?   ? ?Subjective:  ? ?Patient ID: Jeffrey Flynn, male    DOB: [birth year], 61 y.o.   MRN: [id number] ? ?HPI: ?Jeffrey Flynn is a 61 y.o. male presenting on 10/18/2021 for Medical Management of Chronic Issues, Hypertension, and Hyperlipidemia ? ? ?HPI ?Hypertension ?Patient is currently on amlodipine and lisinopril and metoprolol, and their blood pressure today is 129/75. Patient denies any lightheadedness or dizziness. Patient denies headaches, blurred vision, chest pains, shortness of breath, or weakness. Denies any side effects from medication and is content with current medication.  ? ?Hyperlipidemia ?Patient is coming in for recheck of his hyperlipidemia. The patient is currently taking atorvastatin. They deny any issues with myalgias or history of liver damage from it. They deny any focal numbness or weakness or chest pain.  ? ?Relevant past medical, surgical, family and social history reviewed and updated as indicated. Interim medical history since our last visit reviewed. ?Allergies and medications reviewed and updated. ? ?Review of Systems  ?Constitutional:  Negative for chills and fever.  ?Eyes:  Negative for visual disturbance.  ?Respiratory:  Negative for shortness of breath and wheezing.   ?Cardiovascular:  Negative for chest pain and leg swelling.  ?Musculoskeletal:  Negative for back pain and gait problem.  ?Skin:  Negative for rash.  ?Neurological:  Negative for dizziness, weakness and light-headedness.  ?All other systems reviewed and are negative. ? ?Per HPI unless specifically indicated above ? ? ?Allergies as of 10/18/2021   ?No Known Allergies ?  ? ?  ?Medication List  ?  ? ?  ? Accurate as of October 18, 2021 11:35 AM. If you have any questions, ask your nurse or doctor.  ?  ?  ? ?  ? ?amLODipine 10 MG tablet ?Commonly known as: NORVASC ?Take 1 tablet (10 mg total) by mouth daily. ?  ?aspirin EC 81 MG  tablet ?Take 1 tablet (81 mg total) by mouth daily. ?  ?atorvastatin 40 MG tablet ?Commonly known as: LIPITOR ?TAKE 1 TABLET BY MOUTH ONCE DAILY AT  6  PM ?  ?lisinopril 40 MG tablet ?Commonly known as: ZESTRIL ?Take 1 tablet (40 mg total) by mouth daily. ?  ?metoprolol tartrate 100 MG tablet ?Commonly known as: LOPRESSOR ?Take 1 tablet (100 mg total) by mouth 2 (two) times daily. ?What changed: additional instructions ?Changed by: Worthy Rancher, MD ?  ?mupirocin ointment 2 % ?Commonly known as: Bactroban ?Place 1 application into the nose 2 (two) times daily. ?  ? ?  ? ? ? ?Objective:  ? ?BP 129/75   Pulse (!) 50   Ht '6\' 1"'  (1.854 m)   Wt 298 lb (135.2 kg)   SpO2 97%   BMI 39.32 kg/m?   ?Wt Readings from Last 3 Encounters:  ?10/18/21 298 lb (135.2 kg)  ?05/23/21 295 lb (133.8 kg)  ?05/03/21 297 lb (134.7 kg)  ?  ?Physical Exam ?Vitals and nursing note reviewed.  ?Constitutional:   ?   General: He is not in acute distress. ?   Appearance: He is well-developed. He is not diaphoretic.  ?Eyes:  ?   General: No scleral icterus. ?   Conjunctiva/sclera: Conjunctivae normal.  ?Neck:  ?   Thyroid: No thyromegaly.  ?Cardiovascular:  ?   Rate and Rhythm: Normal rate and regular rhythm.  ?   Heart  sounds: Normal heart sounds. No murmur heard. ?Pulmonary:  ?   Effort: Pulmonary effort is normal. No respiratory distress.  ?   Breath sounds: Normal breath sounds. No wheezing.  ?Musculoskeletal:     ?   General: Normal range of motion.  ?   Cervical back: Neck supple.  ?Lymphadenopathy:  ?   Cervical: No cervical adenopathy.  ?Skin: ?   General: Skin is warm and dry.  ?   Findings: No rash.  ?Neurological:  ?   Mental Status: He is alert and oriented to person, place, and time.  ?   Coordination: Coordination normal.  ?Psychiatric:     ?   Behavior: Behavior normal.  ? ? ? ? ?Assessment & Plan:  ? ?Problem List Items Addressed This Visit   ? ?  ? Cardiovascular and Mediastinum  ? Essential hypertension (Chronic)  ?  Relevant Medications  ? metoprolol tartrate (LOPRESSOR) 100 MG tablet  ? lisinopril (ZESTRIL) 40 MG tablet  ? atorvastatin (LIPITOR) 40 MG tablet  ? amLODipine (NORVASC) 10 MG tablet  ? Other Relevant Orders  ? CBC with Differential/Platelet  ? CMP14+EGFR  ? Lipid panel  ?  ? Other  ? Dyslipidemia, goal LDL below 70 - Primary (Chronic)  ? Relevant Medications  ? metoprolol tartrate (LOPRESSOR) 100 MG tablet  ? lisinopril (ZESTRIL) 40 MG tablet  ? atorvastatin (LIPITOR) 40 MG tablet  ? amLODipine (NORVASC) 10 MG tablet  ? Other Relevant Orders  ? CBC with Differential/Platelet  ? CMP14+EGFR  ? Lipid panel  ?Continue current medicine, blood pressure looks good today.  He is still following up with the vascular on his aorta. ? ?Follow up plan: ?Return in about 1 year (around 10/19/2022), or if symptoms worsen or fail to improve, for hypertension and cholesterol. ? ?Counseling provided for all of the vaccine components ?Orders Placed This Encounter  ?Procedures  ? CBC with Differential/Platelet  ? CMP14+EGFR  ? Lipid panel  ? ? ?Caryl Pina, MD ?[place] ?10/18/2021, 11:35 AM ? ? ? ? ?

## 2021-10-19 LAB — CMP14+EGFR
ALT: 31 IU/L (ref 0–44)
AST: 25 IU/L (ref 0–40)
Albumin/Globulin Ratio: 2.1 (ref 1.2–2.2)
Albumin: 4.5 g/dL (ref 3.8–4.9)
Alkaline Phosphatase: 105 IU/L (ref 44–121)
BUN/Creatinine Ratio: 18 (ref 9–20)
BUN: 14 mg/dL (ref 6–24)
Bilirubin Total: 0.7 mg/dL (ref 0.0–1.2)
CO2: 24 mmol/L (ref 20–29)
Calcium: 9.4 mg/dL (ref 8.7–10.2)
Chloride: 106 mmol/L (ref 96–106)
Creatinine, Ser: 0.8 mg/dL (ref 0.76–1.27)
Globulin, Total: 2.1 g/dL (ref 1.5–4.5)
Glucose: 100 mg/dL — ABNORMAL HIGH (ref 70–99)
Potassium: 4.2 mmol/L (ref 3.5–5.2)
Sodium: 145 mmol/L — ABNORMAL HIGH (ref 134–144)
Total Protein: 6.6 g/dL (ref 6.0–8.5)
eGFR: 103 mL/min/{1.73_m2} (ref >59)

## 2021-10-19 LAB — CBC WITH DIFFERENTIAL/PLATELET
Basophils Absolute: 0.1 10*3/uL (ref 0.0–0.2)
Basos: 1 %
EOS (ABSOLUTE): 0.3 10*3/uL (ref 0.0–0.4)
Eos: 4 %
Hematocrit: 47 % (ref 37.5–51.0)
Hemoglobin: 15.8 g/dL (ref 13.0–17.7)
Immature Grans (Abs): 0 10*3/uL (ref 0.0–0.1)
Immature Granulocytes: 0 %
Lymphocytes Absolute: 1.9 10*3/uL (ref 0.7–3.1)
Lymphs: 26 %
MCH: 29.3 pg (ref 26.6–33.0)
MCHC: 33.6 g/dL (ref 31.5–35.7)
MCV: 87 fL (ref 79–97)
Monocytes Absolute: 1 10*3/uL — ABNORMAL HIGH (ref 0.1–0.9)
Monocytes: 14 %
Neutrophils Absolute: 4.1 10*3/uL (ref 1.4–7.0)
Neutrophils: 55 %
Platelets: 231 10*3/uL (ref 150–450)
RBC: 5.39 x10E6/uL (ref 4.14–5.80)
RDW: 13.1 % (ref 11.6–15.4)
WBC: 7.4 10*3/uL (ref 3.4–10.8)

## 2021-10-19 LAB — LIPID PANEL
Chol/HDL Ratio: 3.2 ratio (ref 0.0–5.0)
Cholesterol, Total: 95 mg/dL — ABNORMAL LOW (ref 100–199)
HDL: 30 mg/dL — ABNORMAL LOW (ref >39)
LDL Chol Calc (NIH): 44 mg/dL (ref 0–99)
Triglycerides: 112 mg/dL (ref 0–149)
VLDL Cholesterol Cal: 21 mg/dL (ref 5–40)

## 2021-11-02 ENCOUNTER — Telehealth: Payer: Self-pay | Admitting: Family Medicine

## 2021-11-02 NOTE — Telephone Encounter (Signed)
Calling to discuss lab results. Please call back. Aware a letter was sent.  ?

## 2021-11-02 NOTE — Telephone Encounter (Signed)
Left detailed message on voicemail that lab results were all normal.  ?

## 2021-11-23 ENCOUNTER — Other Ambulatory Visit: Payer: Self-pay

## 2021-11-23 DIAGNOSIS — I71019 Dissection of thoracic aorta, unspecified: Secondary | ICD-10-CM

## 2021-12-07 ENCOUNTER — Ambulatory Visit (HOSPITAL_COMMUNITY)
Admission: RE | Admit: 2021-12-07 | Discharge: 2021-12-07 | Disposition: A | Discharge status: Home / Self Care | Payer: No Typology Code available for payment source | Source: Ambulatory Visit | Attending: Vascular Surgery | Admitting: Vascular Surgery

## 2021-12-07 DIAGNOSIS — I71019 Dissection of thoracic aorta, unspecified: Secondary | ICD-10-CM | POA: Diagnosis present

## 2021-12-07 MED ORDER — IOHEXOL 350 MG/ML SOLN
100.0000 mL | Freq: Once | INTRAVENOUS | Status: AC | PRN
  Administered 2021-12-07: 100 mL via INTRAVENOUS

## 2021-12-12 ENCOUNTER — Ambulatory Visit (INDEPENDENT_AMBULATORY_CARE_PROVIDER_SITE_OTHER): Payer: No Typology Code available for payment source | Admitting: Vascular Surgery

## 2021-12-12 ENCOUNTER — Encounter: Payer: Self-pay | Admitting: Vascular Surgery

## 2021-12-12 VITALS — BP 124/67 | HR 49 | Temp 97.9°F | Resp 20 | Ht 73.0 in | Wt 292.0 lb

## 2021-12-12 DIAGNOSIS — I71019 Dissection of thoracic aorta, unspecified: Secondary | ICD-10-CM

## 2021-12-12 NOTE — Progress Notes (Signed)
Patient ID: Jeffrey Flynn, male   DOB: 1961/06/19, 61 y.o.   MRN: [id number]  Reason for Consult: Follow-up   Referred by Dettinger, Elige Radon, MD  Subjective:     HPI:  Jeffrey Flynn is a 61 y.o. 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  He now has no chest or abdominal pain.  He continues to work daily.  He is a former smoker.  Does not have any history of aneurysm disease prior to this.  He takes aspirin and statin.  His chief complaint today is actually a rash on both mid calves that started in the past couple weeks.  He denies any fevers or chills.  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 Cliffton Asters)   Family History  Problem Relation Age of Onset   Hypertension Mother    Heart attack Mother    Bone cancer Father    Ovarian cancer Sister    Stroke Brother    Heart attack Maternal Grandmother    Prostate cancer Maternal Grandfather    Diabetes type II Paternal Grandfather    Cancer Brother        lung   Neuropathy Brother    Cancer Brother        unknown   Hypertension Brother    Hypertension Brother    Hypertension Sister    Hypertension Sister    Hernia Sister    Hypertension Sister    Hypertension Sister    Diabetes Son        amputee   Past Surgical History:  Procedure Laterality Date   Carotid Dopplers  09/2019   bilateral cervical carotids with no dissection or significant atherosclerosis.  Antegrade flow in left vertebral artery.  Right vertebral artery has bidirectional flow and small caliber.  Bilateral scalene arteries are stenotic with flow disturbance.  There is only a 12 mm pressure difference in both arms   EYE SURGERY Bilateral    cataracts    THORACIC AORTIC  ANEURYSM REPAIR N/A 03/24/2019   Procedure: THORACIC ASCENDING ANEURYSM REPAIR (AAA);  Surgeon: Corliss Skains, MD;  Location: [facility] OR;  Service: Open Heart Surgery;  Laterality: N/A;   TRANSESOPHAGEAL ECHOCARDIOGRAM  03/24/2019   Intra-OP: EF 55-50%. No RWMA.  Non-concentric LVH.  Pre-surgical: Ascending Aorta noted dissection present w/ mobilization flap of Aortic Root, NOT involving Sinus of Valsalva. 70% of cross-sectional area of descending TAA. POST-REPAIR: NO AI (normal AO-Valve)   TRANSTHORACIC ECHOCARDIOGRAM  09/2019   Post aortic root replacement with valve sparing: EF 55 to 60%.  There is inferior basal hypokinesis but otherwise normal wall motion.  Normal diastolic parameters.  Normal right heart with mild RA dilation.  Aortic valve annular thickening noted from resuspension of aortic valve.  No regurgitation noted.  Turbulent flow noted in the arch post anastomosis.    Short Social History:  Social History   Tobacco Use   Smoking status: Former    Packs/day: 1.00    Years: 5.00    Pack years: 5.00    Types: Cigarettes, Cigars    Quit date: 03/26/2009    Years since quitting: 12.7   Smokeless tobacco:  Never  Substance Use Topics   Alcohol use: Never    No Known Allergies  Current Outpatient Medications  Medication Sig Dispense Refill   amLODipine (NORVASC) 10 MG tablet Take 1 tablet (10 mg total) by mouth daily. 90 tablet 3   aspirin EC 81 MG tablet Take 1 tablet (81 mg total) by mouth daily. 90 tablet 3   atorvastatin (LIPITOR) 40 MG tablet TAKE 1 TABLET BY MOUTH ONCE DAILY AT  6  PM 90 tablet 3   lisinopril (ZESTRIL) 40 MG tablet Take 1 tablet (40 mg total) by mouth daily. 90 tablet 3   metoprolol tartrate (LOPRESSOR) 100 MG tablet Take 1 tablet (100 mg total) by mouth 2 (two) times daily. 180 tablet 3   mupirocin ointment (BACTROBAN) 2 % Place 1 application into the nose 2 (two) times daily. 22 g 0   No current facility-administered medications for this visit.     Review of Systems  Constitutional:  Constitutional negative. HENT: HENT negative.  Eyes: Eyes negative.  Cardiovascular: Positive for leg swelling.  GI: Gastrointestinal negative.  Musculoskeletal: Musculoskeletal negative.  Skin: Positive for rash.  Neurological: Neurological negative. Hematologic: Hematologic/lymphatic negative.  Psychiatric: Psychiatric negative.       Objective:  Objective   Vitals:   12/12/21 1419  BP: 124/67  Pulse: (!) 49  Resp: 20  Temp: 97.9 F (36.6 C)  SpO2: 97%  Weight: 292 lb (132.5 kg)  Height: 6\' 1"  (1.854 m)   Body mass index is 38.52 kg/m.  Physical Exam Constitutional:      Appearance: He is normal weight.  HENT:     Head: Normocephalic.     Nose: Nose normal.     Mouth/Throat:     Mouth: Mucous membranes are moist.  Eyes:     Pupils: Pupils are equal, round, and reactive to light.  Cardiovascular:     Rate and Rhythm: Normal rate.  Pulmonary:     Effort: Pulmonary effort is normal.  Abdominal:     General: Abdomen is flat.     Palpations: Abdomen is soft.  Musculoskeletal:        General: Normal range of motion.     Cervical back: Normal range of motion and neck supple.     Right lower leg: Edema present.     Left lower leg: Edema present.  Skin:    General: Skin is warm and dry.     Capillary Refill: Capillary refill takes less than 2 seconds.  Neurological:     General: No focal deficit present.     Mental Status: He is alert.  Psychiatric:        Mood and Affect: Mood normal.        Behavior: Behavior normal.        Thought Content: Thought content normal.    Data: IMPRESSION: Chest CTA impression:   1. Stable sequela of open ascending thoracic aorta repair and hemi arch repair without evidence of complication. 2. Residual type a thoracic aortic dissection, again with preferential arterial perfusion of the false lumen. The dissection is again noted to extend into the proximal aspect the  right brachiocephalic artery, not resulting in a hemodynamically significant stenosis. 3. Suspected slight increased aneurysmal dilatation of the native ascending thoracic aorta now measuring 52 mm in maximal diameter, previously, 47 mm. No perivascular stranding. 4. Stable aneurysmal dilatation of the descending thoracic aorta with measurements as above. 5. Punctate left lower lobe pulmonary nodule is unchanged compared to the 03/2019  examination with imaging stability greater than 2 years indicative benign etiology.   Abdomen and pelvic CTA impression:   1. The thoracic aortic dissection extends through the entirety of the abdominal aorta to the level of the mid/distal aspect of the left common iliac artery. All the great vessels of the abdominal aorta appear to arise from the true lumen with preferential perfusion of the false lumen. 2. The abdominal aorta is mildly ectatic measuring 2.7 cm in diameter. No perivascular stranding. 3. Nonobstructing bilateral nephrolithiasis. 4. Colonic diverticulosis without evidence superimposed acute diverticulitis.     Assessment/Plan:    61 year old male with stable type B dissection after a type a repair.  He has no symptoms.  Plan will be for follow-up in 6 months with repeat CT dissection protocol.  We reviewed his CT scan today and he demonstrates good understanding of the plan.     Maeola HarmanBrandon Christopher Selvin Yun MD [facility]

## 2022-03-28 ENCOUNTER — Other Ambulatory Visit: Payer: Self-pay | Admitting: *Deleted

## 2022-03-28 DIAGNOSIS — I71012 Dissection of descending thoracic aorta: Secondary | ICD-10-CM

## 2022-04-28 ENCOUNTER — Other Ambulatory Visit: Payer: Self-pay

## 2022-04-28 ENCOUNTER — Encounter (HOSPITAL_COMMUNITY): Payer: Self-pay

## 2022-04-28 ENCOUNTER — Emergency Department (HOSPITAL_COMMUNITY)
Admission: EM | Admit: 2022-04-28 | Discharge: 2022-04-28 | Disposition: A | Discharge status: Home / Self Care | Payer: No Typology Code available for payment source | Attending: Emergency Medicine | Admitting: Emergency Medicine

## 2022-04-28 DIAGNOSIS — R3129 Other microscopic hematuria: Secondary | ICD-10-CM | POA: Diagnosis not present

## 2022-04-28 DIAGNOSIS — Z79899 Other long term (current) drug therapy: Secondary | ICD-10-CM | POA: Diagnosis not present

## 2022-04-28 DIAGNOSIS — Z7982 Long term (current) use of aspirin: Secondary | ICD-10-CM | POA: Insufficient documentation

## 2022-04-28 DIAGNOSIS — R339 Retention of urine, unspecified: Secondary | ICD-10-CM | POA: Diagnosis present

## 2022-04-28 LAB — CBC WITH DIFFERENTIAL/PLATELET
Abs Immature Granulocytes: 0.04 10*3/uL (ref 0.00–0.07)
Basophils Absolute: 0.1 10*3/uL (ref 0.0–0.1)
Basophils Relative: 1 %
Eosinophils Absolute: 0.3 10*3/uL (ref 0.0–0.5)
Eosinophils Relative: 2 %
HCT: 49.2 % (ref 39.0–52.0)
Hemoglobin: 16.7 g/dL (ref 13.0–17.0)
Immature Granulocytes: 0 %
Lymphocytes Relative: 14 %
Lymphs Abs: 1.6 10*3/uL (ref 0.7–4.0)
MCH: 29.6 pg (ref 26.0–34.0)
MCHC: 33.9 g/dL (ref 30.0–36.0)
MCV: 87.1 fL (ref 80.0–100.0)
Monocytes Absolute: 1.4 10*3/uL — ABNORMAL HIGH (ref 0.1–1.0)
Monocytes Relative: 12 %
Neutro Abs: 8.4 10*3/uL — ABNORMAL HIGH (ref 1.7–7.7)
Neutrophils Relative %: 71 %
Platelets: 228 10*3/uL (ref 150–400)
RBC: 5.65 MIL/uL (ref 4.22–5.81)
RDW: 13.2 % (ref 11.5–15.5)
WBC: 11.9 10*3/uL — ABNORMAL HIGH (ref 4.0–10.5)
nRBC: 0 % (ref 0.0–0.2)

## 2022-04-28 LAB — URINALYSIS, ROUTINE W REFLEX MICROSCOPIC
Bacteria, UA: NONE SEEN
Bilirubin Urine: NEGATIVE
Glucose, UA: NEGATIVE mg/dL
Ketones, ur: NEGATIVE mg/dL
Leukocytes,Ua: NEGATIVE
Nitrite: NEGATIVE
Protein, ur: NEGATIVE mg/dL
RBC / HPF: >50 RBC/hpf — ABNORMAL HIGH (ref 0–5)
Specific Gravity, Urine: 1.018 (ref 1.005–1.030)
pH: 5 (ref 5.0–8.0)

## 2022-04-28 LAB — BASIC METABOLIC PANEL
Anion gap: 8 (ref 5–15)
BUN: 23 mg/dL — ABNORMAL HIGH (ref 6–20)
CO2: 26 mmol/L (ref 22–32)
Calcium: 9.1 mg/dL (ref 8.9–10.3)
Chloride: 106 mmol/L (ref 98–111)
Creatinine, Ser: 1.24 mg/dL (ref 0.61–1.24)
GFR, Estimated: >60 mL/min (ref >60)
Glucose, Bld: 113 mg/dL — ABNORMAL HIGH (ref 70–99)
Potassium: 4 mmol/L (ref 3.5–5.1)
Sodium: 140 mmol/L (ref 135–145)

## 2022-04-28 MED ORDER — OXYCODONE HCL 5 MG PO TABS
5.0000 mg | ORAL_TABLET | Freq: Once | ORAL | Status: AC
  Administered 2022-04-28: 5 mg via ORAL
  Filled 2022-04-28: qty 1

## 2022-04-28 NOTE — ED Provider Notes (Signed)
[facility] EMERGENCY DEPARTMENT Provider Note   CSN: [id number] Arrival date & time: 04/28/22  1330     History {Add pertinent medical, surgical, social history, OB history to HPI:1} Chief Complaint  Patient presents with   Urinary Retention    Jeffrey Flynn is a 61 y.o. male.  61 yo male presenting for difficulty urinating. Hx of utis. No gross hematuria. No flank pain.   The history is provided by the patient. No language interpreter was used.       Home Medications Prior to Admission medications   Medication Sig Start Date End Date Taking? Authorizing Provider  amLODipine (NORVASC) 10 MG tablet Take 1 tablet (10 mg total) by mouth daily. 10/18/21   Dettinger, Fransisca Kaufmann, MD  aspirin EC 81 MG tablet Take 1 tablet (81 mg total) by mouth daily. 11/15/19   Leonie Man, MD  atorvastatin (LIPITOR) 40 MG tablet TAKE 1 TABLET BY MOUTH ONCE DAILY AT  6  PM 10/18/21   Dettinger, Fransisca Kaufmann, MD  lisinopril (ZESTRIL) 40 MG tablet Take 1 tablet (40 mg total) by mouth daily. 10/18/21   Dettinger, Fransisca Kaufmann, MD  metoprolol tartrate (LOPRESSOR) 100 MG tablet Take 1 tablet (100 mg total) by mouth 2 (two) times daily. 10/18/21   Dettinger, Fransisca Kaufmann, MD  mupirocin ointment (BACTROBAN) 2 % Place 1 application into the nose 2 (two) times daily. 05/12/19   Baruch Gouty, FNP      Allergies    Patient has no known allergies.    Review of Systems   Review of Systems  Constitutional:  Negative for chills and fever.  HENT:  Negative for ear pain and sore throat.   Eyes:  Negative for pain and visual disturbance.  Respiratory:  Negative for cough and shortness of breath.   Cardiovascular:  Negative for chest pain and palpitations.  Gastrointestinal:  Negative for abdominal pain and vomiting.  Genitourinary:  Positive for decreased urine volume. Negative for dysuria and hematuria.  Musculoskeletal:  Negative for arthralgias and back pain.  Skin:  Negative for color change and rash.  Neurological:   Negative for seizures and syncope.  All other systems reviewed and are negative.   Physical Exam Updated Vital Signs BP (!) 140/81 (BP Location: Right Arm)   Pulse 62   Temp (!) 97.5 F (36.4 C) (Oral)   Resp 18   Ht 6\' 1"  (1.854 m)   Wt 136.1 kg   SpO2 99%   BMI 39.58 kg/m  Physical Exam Vitals and nursing note reviewed.  Constitutional:      General: He is not in acute distress.    Appearance: He is well-developed.  HENT:     Head: Normocephalic and atraumatic.  Eyes:     Conjunctiva/sclera: Conjunctivae normal.  Cardiovascular:     Rate and Rhythm: Normal rate and regular rhythm.     Heart sounds: No murmur heard. Pulmonary:     Effort: Pulmonary effort is normal. No respiratory distress.     Breath sounds: Normal breath sounds.  Abdominal:     Palpations: Abdomen is soft.     Tenderness: There is no abdominal tenderness.  Musculoskeletal:        General: No swelling.     Cervical back: Neck supple.  Skin:    General: Skin is warm and dry.     Capillary Refill: Capillary refill takes less than 2 seconds.  Neurological:     Mental Status: He is alert.  Psychiatric:  Mood and Affect: Mood normal.     ED Results / Procedures / Treatments   Labs (all labs ordered are listed, but only abnormal results are displayed) Labs Reviewed  CBC WITH DIFFERENTIAL/PLATELET - Abnormal; Notable for the following components:      Result Value   WBC 11.9 (*)    Neutro Abs 8.4 (*)    Monocytes Absolute 1.4 (*)    All other components within normal limits  BASIC METABOLIC PANEL - Abnormal; Notable for the following components:   Glucose, Bld 113 (*)    BUN 23 (*)    All other components within normal limits  URINALYSIS, ROUTINE W REFLEX MICROSCOPIC - Abnormal; Notable for the following components:   Hgb urine dipstick LARGE (*)    RBC / HPF >50 (*)    All other components within normal limits    EKG None  Radiology No results found.  Procedures Procedures   {Document cardiac monitor, telemetry assessment procedure when appropriate:1}  Medications Ordered in ED Medications - No data to display  ED Course/ Medical Decision Making/ A&P                           Medical Decision Making Amount and/or Complexity of Data Reviewed Labs: ordered.  Risk Prescription drug management.   4:16 PM 61 yo male presenting for difficulty urinating.   Bladder scan x 2 demonstrates no retention.  No UTI on UA. Hematuria present.  Abdomen soft and tendender  F/u with urology given.   Patient in no distress and overall condition improved here in the ED. Detailed discussions were had with the patient regarding current findings, and need for close f/u with PCP or on call doctor. The patient has been instructed to return immediately if the symptoms worsen in any way for re-evaluation. Patient verbalized understanding and is in agreement with current care plan. All questions answered prior to discharge.   {Document critical care time when appropriate:1} {Document review of labs and clinical decision tools ie heart score, Chads2Vasc2 etc:1}  {Document your independent review of radiology images, and any outside records:1} {Document your discussion with family members, caretakers, and with consultants:1} {Document social determinants of health affecting pt's care:1} {Document your decision making why or why not admission, treatments were needed:1} Final Clinical Impression(s) / ED Diagnoses Final diagnoses:  None    Rx / DC Orders ED Discharge Orders     None

## 2022-04-28 NOTE — ED Triage Notes (Signed)
Report having trouble peeing. Pt urinating small amounts. Started this morning.

## 2022-04-28 NOTE — Discharge Instructions (Signed)
Please follow-up with urologist in the next 5 to 7 days for microscopic blood in urine and abdominal fullness  To emergency department immediately for any worsening abdominal pain or lack of urination for reevaluation.  Today you are not found to have any urinary retention or urinary tract infection.

## 2022-04-29 ENCOUNTER — Telehealth: Payer: Self-pay

## 2022-04-29 ENCOUNTER — Encounter (HOSPITAL_COMMUNITY): Payer: Self-pay | Admitting: Emergency Medicine

## 2022-04-29 ENCOUNTER — Other Ambulatory Visit: Payer: Self-pay

## 2022-04-29 ENCOUNTER — Emergency Department (HOSPITAL_COMMUNITY)
Admission: EM | Admit: 2022-04-29 | Discharge: 2022-04-30 | Disposition: A | Discharge status: Home / Self Care | Payer: No Typology Code available for payment source | Attending: Emergency Medicine | Admitting: Emergency Medicine

## 2022-04-29 DIAGNOSIS — I10 Essential (primary) hypertension: Secondary | ICD-10-CM | POA: Diagnosis not present

## 2022-04-29 DIAGNOSIS — N200 Calculus of kidney: Secondary | ICD-10-CM

## 2022-04-29 DIAGNOSIS — Z79899 Other long term (current) drug therapy: Secondary | ICD-10-CM | POA: Insufficient documentation

## 2022-04-29 DIAGNOSIS — R1032 Left lower quadrant pain: Secondary | ICD-10-CM | POA: Diagnosis present

## 2022-04-29 DIAGNOSIS — N202 Calculus of kidney with calculus of ureter: Secondary | ICD-10-CM | POA: Diagnosis not present

## 2022-04-29 DIAGNOSIS — D72829 Elevated white blood cell count, unspecified: Secondary | ICD-10-CM | POA: Insufficient documentation

## 2022-04-29 DIAGNOSIS — Z7982 Long term (current) use of aspirin: Secondary | ICD-10-CM | POA: Insufficient documentation

## 2022-04-29 NOTE — Telephone Encounter (Signed)
Transition Care Management Unsuccessful Follow-up Telephone Call  Date of discharge and from where:  04/28/2022 Jeffrey Flynn   Attempts:  1st Attempt  Reason for unsuccessful TCM follow-up call:  Left voice message

## 2022-04-29 NOTE — ED Triage Notes (Signed)
Pt c/o lower left sided abd pain; pt reports he was seen for same yesterday, states he has been drinking water since d/c and he reports he now has no urinary symptoms but new onset left lower abd pain

## 2022-04-30 ENCOUNTER — Emergency Department (HOSPITAL_COMMUNITY): Payer: No Typology Code available for payment source

## 2022-04-30 LAB — CBC WITH DIFFERENTIAL/PLATELET
Abs Immature Granulocytes: 0.07 10*3/uL (ref 0.00–0.07)
Basophils Absolute: 0 10*3/uL (ref 0.0–0.1)
Basophils Relative: 0 %
Eosinophils Absolute: 0 10*3/uL (ref 0.0–0.5)
Eosinophils Relative: 0 %
HCT: 47.2 % (ref 39.0–52.0)
Hemoglobin: 16.3 g/dL (ref 13.0–17.0)
Immature Granulocytes: 1 %
Lymphocytes Relative: 9 %
Lymphs Abs: 1.3 10*3/uL (ref 0.7–4.0)
MCH: 30 pg (ref 26.0–34.0)
MCHC: 34.5 g/dL (ref 30.0–36.0)
MCV: 86.9 fL (ref 80.0–100.0)
Monocytes Absolute: 1.8 10*3/uL — ABNORMAL HIGH (ref 0.1–1.0)
Monocytes Relative: 12 %
Neutro Abs: 11.3 10*3/uL — ABNORMAL HIGH (ref 1.7–7.7)
Neutrophils Relative %: 78 %
Platelets: 201 10*3/uL (ref 150–400)
RBC: 5.43 MIL/uL (ref 4.22–5.81)
RDW: 13.2 % (ref 11.5–15.5)
WBC: 14.4 10*3/uL — ABNORMAL HIGH (ref 4.0–10.5)
nRBC: 0 % (ref 0.0–0.2)

## 2022-04-30 LAB — BASIC METABOLIC PANEL
Anion gap: 11 (ref 5–15)
BUN: 22 mg/dL — ABNORMAL HIGH (ref 6–20)
CO2: 24 mmol/L (ref 22–32)
Calcium: 8.8 mg/dL — ABNORMAL LOW (ref 8.9–10.3)
Chloride: 101 mmol/L (ref 98–111)
Creatinine, Ser: 1.71 mg/dL — ABNORMAL HIGH (ref 0.61–1.24)
GFR, Estimated: 46 mL/min — ABNORMAL LOW (ref >60)
Glucose, Bld: 129 mg/dL — ABNORMAL HIGH (ref 70–99)
Potassium: 3.7 mmol/L (ref 3.5–5.1)
Sodium: 136 mmol/L (ref 135–145)

## 2022-04-30 MED ORDER — ONDANSETRON HCL 4 MG/2ML IJ SOLN
4.0000 mg | Freq: Once | INTRAMUSCULAR | Status: AC
  Administered 2022-04-30: 4 mg via INTRAVENOUS
  Filled 2022-04-30: qty 2

## 2022-04-30 MED ORDER — MORPHINE SULFATE (PF) 4 MG/ML IV SOLN
4.0000 mg | Freq: Once | INTRAVENOUS | Status: AC
  Administered 2022-04-30: 4 mg via INTRAVENOUS
  Filled 2022-04-30: qty 1

## 2022-04-30 MED ORDER — KETOROLAC TROMETHAMINE 30 MG/ML IJ SOLN
30.0000 mg | Freq: Once | INTRAMUSCULAR | Status: AC
  Administered 2022-04-30: 30 mg via INTRAVENOUS
  Filled 2022-04-30: qty 1

## 2022-04-30 MED ORDER — OXYCODONE-ACETAMINOPHEN 5-325 MG PO TABS: 1.0000 | ORAL_TABLET | Freq: Four times a day (QID) | ORAL | 0 refills | Status: DC | PRN

## 2022-04-30 NOTE — ED Provider Notes (Signed)
[facility] EMERGENCY DEPARTMENT Provider Note   CSN: [id number] Arrival date & time: 04/29/22  1928     History  Chief Complaint  Patient presents with   Abdominal Pain    Jeffrey Flynn is a 61 y.o. male.  Patient is a 61 year old male with past medical history of hypertension, aortic dissection, dyslipidemia.  Patient presenting today with complaints of left lower quadrant pain.  He was seen yesterday for urinary urgency.  Urinalysis did not show infection, only blood and patient was discharged home without imaging.  He began having pain this evening.  He denies any fevers or chills.  He has felt nauseated and has vomited.  He denies any bowel complaints.  The history is provided by the patient.       Home Medications Prior to Admission medications   Medication Sig Start Date End Date Taking? Authorizing Provider  amLODipine (NORVASC) 10 MG tablet Take 1 tablet (10 mg total) by mouth daily. 10/18/21   Dettinger, Fransisca Kaufmann, MD  aspirin EC 81 MG tablet Take 1 tablet (81 mg total) by mouth daily. 11/15/19   Leonie Man, MD  atorvastatin (LIPITOR) 40 MG tablet TAKE 1 TABLET BY MOUTH ONCE DAILY AT  6  PM 10/18/21   Dettinger, Fransisca Kaufmann, MD  lisinopril (ZESTRIL) 40 MG tablet Take 1 tablet (40 mg total) by mouth daily. 10/18/21   Dettinger, Fransisca Kaufmann, MD  metoprolol tartrate (LOPRESSOR) 100 MG tablet Take 1 tablet (100 mg total) by mouth 2 (two) times daily. 10/18/21   Dettinger, Fransisca Kaufmann, MD  mupirocin ointment (BACTROBAN) 2 % Place 1 application into the nose 2 (two) times daily. 05/12/19   Baruch Gouty, FNP      Allergies    Patient has no known allergies.    Review of Systems   Review of Systems  All other systems reviewed and are negative.   Physical Exam Updated Vital Signs BP (!) 121/95 (BP Location: Left Arm)   Pulse 66   Temp 98.1 F (36.7 C) (Oral)   Resp 20   Ht 6\' 1"  (1.854 m)   Wt 136.1 kg   SpO2 96%   BMI 39.58 kg/m  Physical Exam Vitals and nursing note  reviewed.  Constitutional:      General: He is not in acute distress.    Appearance: He is well-developed. He is not diaphoretic.  HENT:     Head: Normocephalic and atraumatic.  Cardiovascular:     Rate and Rhythm: Normal rate and regular rhythm.     Heart sounds: No murmur heard.    No friction rub.  Pulmonary:     Effort: Pulmonary effort is normal. No respiratory distress.     Breath sounds: Normal breath sounds. No wheezing or rales.  Abdominal:     General: Bowel sounds are normal. There is no distension.     Palpations: Abdomen is soft.     Tenderness: There is abdominal tenderness in the left lower quadrant. There is no right CVA tenderness, left CVA tenderness, guarding or rebound.  Musculoskeletal:        General: Normal range of motion.     Cervical back: Normal range of motion and neck supple.  Skin:    General: Skin is warm and dry.  Neurological:     Mental Status: He is alert and oriented to person, place, and time.     Coordination: Coordination normal.     ED Results / Procedures / Treatments   Labs (  all labs ordered are listed, but only abnormal results are displayed) Labs Reviewed  CBC WITH DIFFERENTIAL/PLATELET  BASIC METABOLIC PANEL    EKG None  Radiology No results found.  Procedures Procedures    Medications Ordered in ED Medications  ketorolac (TORADOL) 30 MG/ML injection 30 mg (has no administration in time range)  morphine (PF) 4 MG/ML injection 4 mg (has no administration in time range)  ondansetron (ZOFRAN) injection 4 mg (has no administration in time range)    ED Course/ Medical Decision Making/ A&P  Patient presenting here with complaints of left lower quadrant pain 1 day after presenting with hematuria.  His urinalysis showed blood, however no specific treatment was prescribed.  He returns today with left lower quadrant.  He arrives here with stable vital signs and is afebrile.  He is clinically well-appearing.  He does have  tenderness to palpation in the left lower quadrant, but no left flank pain.  Physical examination is otherwise unremarkable.  Work-up initiated including CBC and metabolic panel.  He does have a leukocytosis, but electrolytes are unremarkable.  CT scan obtained shows a 3 mm calculus at the left UVJ.  He is feeling better after receiving Toradol, morphine, and Zofran.  Patient is afebrile and nontoxic in appearance.  I feel as though he can safely be discharged with pain medication and as needed follow-up with urology.  Final Clinical Impression(s) / ED Diagnoses Final diagnoses:  None    Rx / DC Orders ED Discharge Orders     None         Veryl Speak, MD 04/30/22 [phone]

## 2022-04-30 NOTE — Discharge Instructions (Signed)
Begin taking Percocet as prescribed as needed for pain.  Follow-up with [facility] if symptoms are not improving in the next few days.  The contact information has been provided in this discharge summary for you to call and make these arrangements.

## 2022-04-30 NOTE — Telephone Encounter (Signed)
Transition Care Management Follow-up Telephone Call Date of discharge and from where: 04/28/22 [facility] How have you been since you were released from the hospital? Patient is doing well - pain has improved  Any questions or concerns? No  Items Reviewed: Did the pt receive and understand the discharge instructions provided? Yes  Medications obtained and verified? Yes  Other? Yes  Any new allergies since your discharge? Yes  Dietary orders reviewed? Yes Do you have support at home? Yes   Home Care and Equipment/Supplies: Were home health services ordered? no If so, what is the name of the agency? NA  Has the agency set up a time to come to the patient's home? not applicable Were any new equipment or medical supplies ordered?  No What is the name of the medical supply agency? NA Were you able to get the supplies/equipment? not applicable Do you have any questions related to the use of the equipment or supplies? No  Functional Questionnaire: (I = Independent and D = Dependent) ADLs: I  Bathing/Dressing- I  Meal Prep- I  Eating- I  Maintaining continence- I  Transferring/Ambulation- I  Managing Meds- I  Follow up appointments reviewed:  PCP Hospital f/u appt confirmed?  Patient declines at this moment will call back to schedule if needed   [facility] f/u appt confirmed? NA  Are transportation arrangements needed? No  If their condition worsens, is the pt aware to call PCP or go to the Emergency Dept.? Yes Was the patient provided with contact information for the PCP's office or ED? Yes Was to pt encouraged to call back with questions or concerns? Yes

## 2022-05-10 ENCOUNTER — Telehealth: Payer: No Typology Code available for payment source | Admitting: Thoracic Surgery (Cardiothoracic Vascular Surgery)

## 2022-05-13 ENCOUNTER — Other Ambulatory Visit: Payer: Self-pay | Admitting: *Deleted

## 2022-05-13 DIAGNOSIS — I71012 Dissection of descending thoracic aorta: Secondary | ICD-10-CM

## 2022-05-30 NOTE — Progress Notes (Signed)
[place] [address]       [place], [id number]             [phone]   Chief Complaint: Ascending thoracic aortic aneurysm  HPI: Patient returns for further surveillance after having had a right artery cannulation with an 54mm Hemashield graft, anterograde cerebral  perfusion with circulatory arrest, ascending aortic repair with hemi-arch, aortic valve re suspension, and right femoral arterial line placement by Dr. Servando Snare and Dr. Kipp Brood on 03/25/2019. He had a virtual visit  with Dr. Kipp Brood to discuss the result of the CTA on 05/04/2021, which showed enlargement and his descending thoracic  aorta from 4.3 cm to 5.3 cm of the course of the year. He was seen by Dr. Donzetta Matters from Vascular Surgery on 05/23/2021 and it was discussed at that time that the descending thoracic aorta was enlarging down to 5.3 cm.  Dr. Donzetta Matters discussed likely repair at 6 cm.  He also stated it did appear it would probably  need to branching component with stenting. He was then seen by him on 12/12/2021 and the descending thoracic aorta was stable on the most recent CTA.  Patient denies chest pain or shortness of breath. His occupation is environmental services (cleaning) and because he is on his foot most of the day, he gets edema of LEs.  Current Outpatient Medications  Medication Sig Dispense Refill   amLODipine (NORVASC) 10 MG tablet Take 1 tablet (10 mg total) by mouth daily. 90 tablet 3   aspirin EC 81 MG tablet Take 1 tablet (81 mg total) by mouth daily. 90 tablet 3   atorvastatin (LIPITOR) 40 MG tablet TAKE 1 TABLET BY MOUTH ONCE DAILY AT  6  PM 90 tablet 3   lisinopril (ZESTRIL) 40 MG tablet Take 1 tablet (40 mg total) by mouth daily. 90 tablet 3   metoprolol tartrate (LOPRESSOR) 100 MG tablet Take 1 tablet (100 mg total) by mouth 2 (two) times daily. 180 tablet 3   mupirocin ointment (BACTROBAN) 2 % Place 1 application into the nose 2 (two) times daily. 22 g 0   oxyCODONE-acetaminophen  (PERCOCET) 5-325 MG tablet Take 1-2 tablets by mouth every 6 (six) hours as needed. 20 tablet 0   Review of Systems: Chest Pain [ N ] Resting SOB [ N] Exertional SOB [ N ]  Syncope [ N ] Presyncope [ N ]  General Review of Systems: [Y] = yes [ N]=no  Consitutional:   nausea [ N];  fever [ N];  Eye : blurred vision [ ] ; Amaurosis fugax[  ];  Resp: cough [ N];  hemoptysis[ ] ;  GI: vomiting[N ];  Heme/Lymph: anemia[N ];  Neuro: TIA[ N];stroke[N ];     Vital Signs: Vitals:   06/03/22 1353  BP: 129/76  Pulse: 60  Resp: 18  SpO2: 93%     Physical Exam: CV-RRR Pulmonary-Clear to auscultation bilaterally Abdomen-Soft, obese, non tender, bowel sounds present Extremities-Trace LE edema Neurologic-Grossly intact without focal deficit  Diagnostic Tests: Narrative & Impression  CLINICAL DATA:  Aortic dissection, post repair, follow-up   EXAM: CT ANGIOGRAPHY CHEST WITH CONTRAST   TECHNIQUE: Multidetector CT imaging of the chest was performed using the standard protocol during bolus administration of intravenous contrast. Multiplanar CT image reconstructions and MIPs were obtained to evaluate the vascular anatomy.   RADIATION DOSE REDUCTION: This exam was performed according to the departmental dose-optimization program which includes automated exposure control, adjustment of the mA and/or kV according to patient  size and/or use of iterative reconstruction technique.   CONTRAST:  24mL ISOVUE-370 IOPAMIDOL (ISOVUE-370) INJECTION 76%   COMPARISON:  12/07/2021   FINDINGS: Cardiovascular: Heart size normal. No pericardial effusion. The RV is nondilated. Fair contrast opacification of pulmonary arterial tree without filling defect to suggest acute PE. Scattered coronary calcifications.   Changes of tube graft repair of the ascending aorta. Persistent dissection flap through the arch and descending thoracic aorta. Some eccentric mural thrombus in the large false lumen at the  level of the carina, with mild compression of the true lumen in the mid and distal descending segments. Dissection flap extends into the proximal right brachiocephalic artery, without definite extension into the common carotid or subclavian arteries. Left common carotid and subclavian arteries supplied by the true lumen.   Aortic Root:   --Valve: 3.2 cm   --Sinuses: 4.2 cm   --Sinotubular Junction: 3.3 cm   Limitations by motion: Mild   Thoracic Aorta:   --Ascending Aorta: Graft   --Aortic Arch: 5 cm (stable since previous)   --Descending Aorta: 5.5 cm just above the level of the left pulmonary artery (previously 5.4)   Mediastinum/Nodes: No mediastinal hematoma, mass or adenopathy.   Lungs/Pleura: No pleural effusion. No pneumothorax. Lungs are clear.   Upper Abdomen: Dilated suprarenal abdominal aorta 3.9 cm diameter, stable. Large false lumen compresses the smaller true lumen, stable since previous. No acute findings.   Musculoskeletal: Sternotomy wires. Anterior vertebral endplate spurring at multiple levels in the lower thoracic spine.   Review of the MIP images confirms the above findings.   IMPRESSION: 1. Stable changes of tube graft repair of the ascending aorta. 2. Persistent dissection flap through the arch and descending thoracic aorta, with stable 5 cm aneurysmal dilatation of the aortic arch. 3. 5.5 cm descending thoracic aortic aneurysm, previously 5.4 cm. 4. 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     Electronically Signed   By: Corlis Leak M.D.   On: 06/03/2022 14:14     Impression and Plan: We reviewed the above results on today's CTA. Per Dr. Darcella Cheshire last note, patient supposed to be seen by the end of this month.  Patient encouraged to continue with good blood pressure control (on Lopressor, Lisinopril, Amlodipine) and continue Lipitor. Regarding surveillance of previous repair of Type A dissection, we will continue yearly  surveillance;however, he will need surveillance of his descending thoracic aortic aneurysm every 6 months or at Vascular Surgeon's discretion. Patient asked to contact Vascular surgery office and we will follow up as well.  Ardelle Balls, PA-C [facility] [phone]

## 2022-05-30 NOTE — Progress Notes (Deleted)
      [address]       [place] [address]             [phone]       

## 2022-06-03 ENCOUNTER — Ambulatory Visit (INDEPENDENT_AMBULATORY_CARE_PROVIDER_SITE_OTHER): Payer: No Typology Code available for payment source | Admitting: Physician Assistant

## 2022-06-03 ENCOUNTER — Ambulatory Visit
Admission: RE | Admit: 2022-06-03 | Discharge: 2022-06-03 | Disposition: A | Discharge status: Home / Self Care | Payer: No Typology Code available for payment source | Source: Ambulatory Visit | Attending: Thoracic Surgery (Cardiothoracic Vascular Surgery) | Admitting: Thoracic Surgery (Cardiothoracic Vascular Surgery)

## 2022-06-03 ENCOUNTER — Encounter: Payer: Self-pay | Admitting: Physician Assistant

## 2022-06-03 VITALS — BP 129/76 | HR 60 | Resp 18 | Ht 73.0 in | Wt 291.0 lb

## 2022-06-03 DIAGNOSIS — I71012 Dissection of descending thoracic aorta: Secondary | ICD-10-CM

## 2022-06-03 DIAGNOSIS — I7101 Dissection of ascending aorta: Secondary | ICD-10-CM

## 2022-06-03 MED ORDER — IOPAMIDOL (ISOVUE-370) INJECTION 76%
75.0000 mL | Freq: Once | INTRAVENOUS | Status: AC | PRN
  Administered 2022-06-03: 68 mL via INTRAVENOUS

## 2022-06-04 ENCOUNTER — Telehealth: Payer: Self-pay | Admitting: Vascular Surgery

## 2022-06-04 NOTE — Telephone Encounter (Signed)
-----   Message from Maeola Harman, MD sent at 06/03/2022  4:56 PM EST ----- Patient needs to see me in the next 4-6 weeks with CT angio chest/abdomen/pelvis.   Apolinar Junes

## 2022-07-23 ENCOUNTER — Other Ambulatory Visit: Payer: Self-pay | Admitting: Family Medicine

## 2022-07-23 DIAGNOSIS — I10 Essential (primary) hypertension: Secondary | ICD-10-CM

## 2022-08-23 ENCOUNTER — Encounter: Payer: Self-pay | Admitting: Vascular Surgery

## 2022-09-25 ENCOUNTER — Encounter: Payer: No Typology Code available for payment source | Admitting: Vascular Surgery

## 2022-10-21 ENCOUNTER — Encounter (INDEPENDENT_AMBULATORY_CARE_PROVIDER_SITE_OTHER): Payer: Self-pay | Admitting: *Deleted

## 2022-10-21 ENCOUNTER — Encounter: Payer: Self-pay | Admitting: Family Medicine

## 2022-10-21 ENCOUNTER — Ambulatory Visit (INDEPENDENT_AMBULATORY_CARE_PROVIDER_SITE_OTHER): Payer: No Typology Code available for payment source | Admitting: Family Medicine

## 2022-10-21 VITALS — BP 141/81 | HR 62 | Temp 97.8°F | Ht 73.0 in | Wt 291.6 lb

## 2022-10-21 DIAGNOSIS — Z125 Encounter for screening for malignant neoplasm of prostate: Secondary | ICD-10-CM

## 2022-10-21 DIAGNOSIS — E785 Hyperlipidemia, unspecified: Secondary | ICD-10-CM

## 2022-10-21 DIAGNOSIS — Z0001 Encounter for general adult medical examination with abnormal findings: Secondary | ICD-10-CM | POA: Diagnosis not present

## 2022-10-21 DIAGNOSIS — I10 Essential (primary) hypertension: Secondary | ICD-10-CM

## 2022-10-21 DIAGNOSIS — Z0000 Encounter for general adult medical examination without abnormal findings: Secondary | ICD-10-CM

## 2022-10-21 DIAGNOSIS — Z1211 Encounter for screening for malignant neoplasm of colon: Secondary | ICD-10-CM

## 2022-10-21 MED ORDER — AMLODIPINE BESYLATE 10 MG PO TABS: 10.0000 mg | ORAL_TABLET | Freq: Every day | ORAL | 3 refills | Status: DC

## 2022-10-21 MED ORDER — METOPROLOL TARTRATE 100 MG PO TABS: 100.0000 mg | ORAL_TABLET | Freq: Two times a day (BID) | ORAL | 3 refills | Status: DC

## 2022-10-21 MED ORDER — ATORVASTATIN CALCIUM 40 MG PO TABS: ORAL_TABLET | ORAL | 3 refills | Status: DC

## 2022-10-21 MED ORDER — LISINOPRIL 40 MG PO TABS: 40.0000 mg | ORAL_TABLET | Freq: Every day | ORAL | 3 refills | Status: DC

## 2022-10-21 NOTE — Progress Notes (Signed)
BP (!) 141/81   Pulse 62   Temp 97.8 F (36.6 C)   Ht 6\' 1"  (1.854 m)   Wt 291 lb 9.6 oz (132.3 kg)   SpO2 93%   BMI 38.47 kg/m    Subjective:   Patient ID: Jeffrey Flynn, male    DOB: 1961-04-19, 62 y.o.   MRN: [id number]  HPI: Jeffrey Flynn is a 62 y.o. male presenting on 10/21/2022 for Annual Exam   HPI Physical exam Patient denies any chest pain, shortness of breath, headaches or vision issues, abdominal complaints, diarrhea, nausea, vomiting, or joint issues.  He had a stomach virus last couple days but is better today, it was given around his house.  Hyperlipidemia Patient is coming in for recheck of his hyperlipidemia. The patient is currently taking atorvastatin. They deny any issues with myalgias or history of liver damage from it. They deny any focal numbness or weakness or chest pain.   Hypertension Patient is currently on metoprolol and lisinopril and amlodipine, and their blood pressure today is 141/81. Patient denies any lightheadedness or dizziness. Patient denies headaches, blurred vision, chest pains, shortness of breath, or weakness. Denies any side effects from medication and is content with current medication.   Relevant past medical, surgical, family and social history reviewed and updated as indicated. Interim medical history since our last visit reviewed. Allergies and medications reviewed and updated.  Review of Systems  Constitutional:  Negative for chills and fever.  Eyes:  Negative for visual disturbance.  Respiratory:  Negative for shortness of breath and wheezing.   Cardiovascular:  Negative for chest pain and leg swelling.  Gastrointestinal:  Negative for abdominal distention, abdominal pain, diarrhea, nausea and vomiting.  Musculoskeletal:  Negative for back pain and gait problem.  Skin:  Negative for rash.  Neurological:  Negative for dizziness and light-headedness.  All other systems reviewed and are negative.   Per HPI unless specifically  indicated above   Allergies as of 10/21/2022   No Known Allergies      Medication List        Accurate as of October 21, 2022  1:36 PM. If you have any questions, ask your nurse or doctor.          amLODipine 10 MG tablet Commonly known as: NORVASC Take 1 tablet (10 mg total) by mouth daily.   aspirin EC 81 MG tablet Take 1 tablet (81 mg total) by mouth daily.   atorvastatin 40 MG tablet Commonly known as: LIPITOR TAKE 1 TABLET BY MOUTH ONCE DAILY AT  6  PM   lisinopril 40 MG tablet Commonly known as: ZESTRIL Take 1 tablet (40 mg total) by mouth daily.   metoprolol tartrate 100 MG tablet Commonly known as: LOPRESSOR Take 1 tablet (100 mg total) by mouth 2 (two) times daily.   mupirocin ointment 2 % Commonly known as: Bactroban Place 1 application into the nose 2 (two) times daily.   oxyCODONE-acetaminophen 5-325 MG tablet Commonly known as: Percocet Take 1-2 tablets by mouth every 6 (six) hours as needed.         Objective:   BP (!) 141/81   Pulse 62   Temp 97.8 F (36.6 C)   Ht 6\' 1"  (1.854 m)   Wt 291 lb 9.6 oz (132.3 kg)   SpO2 93%   BMI 38.47 kg/m   Wt Readings from Last 3 Encounters:  10/21/22 291 lb 9.6 oz (132.3 kg)  06/03/22 291 lb (132 kg)  04/29/22 300 lb (  136.1 kg)    Physical Exam Vitals and nursing note reviewed.  Constitutional:      General: He is not in acute distress.    Appearance: He is well-developed. He is not diaphoretic.  Eyes:     General: No scleral icterus.       Right eye: No discharge.     Conjunctiva/sclera: Conjunctivae normal.  Neck:     Thyroid: No thyromegaly.  Cardiovascular:     Rate and Rhythm: Normal rate and regular rhythm.     Heart sounds: Normal heart sounds. No murmur heard. Pulmonary:     Effort: Pulmonary effort is normal. No respiratory distress.     Breath sounds: Normal breath sounds. No wheezing.  Musculoskeletal:        General: No swelling. Normal range of motion.     Cervical back: Neck  supple.  Lymphadenopathy:     Cervical: No cervical adenopathy.  Skin:    General: Skin is warm and dry.     Findings: No rash.  Neurological:     Mental Status: He is alert and oriented to person, place, and time.     Coordination: Coordination normal.  Psychiatric:        Behavior: Behavior normal.       Assessment & Plan:   Problem List Items Addressed This Visit       Cardiovascular and Mediastinum   Essential hypertension (Chronic)   Relevant Medications   amLODipine (NORVASC) 10 MG tablet   atorvastatin (LIPITOR) 40 MG tablet   lisinopril (ZESTRIL) 40 MG tablet   metoprolol tartrate (LOPRESSOR) 100 MG tablet   Other Relevant Orders   CBC with Differential/Platelet   CMP14+EGFR   Lipid panel     Other   Dyslipidemia, goal LDL below 70 (Chronic)   Relevant Medications   amLODipine (NORVASC) 10 MG tablet   atorvastatin (LIPITOR) 40 MG tablet   lisinopril (ZESTRIL) 40 MG tablet   metoprolol tartrate (LOPRESSOR) 100 MG tablet   Other Relevant Orders   CBC with Differential/Platelet   CMP14+EGFR   Lipid panel   Other Visit Diagnoses     Physical exam    -  Primary   Relevant Orders   PSA, total and free   Prostate cancer screening       Relevant Orders   PSA, total and free   Colon cancer screening       Relevant Orders   Ambulatory referral to Gastroenterology       Placed referral for gastroenterology, will do blood work today.  No change in medication, he will keep a close eye on blood pressure over the next month and let me know if it is running but he thinks likely elevated because he has been sick the last couple days from the stomach bug.  He is feeling better today Follow up plan: Return in about 6 months (around 04/22/2023), or if symptoms worsen or fail to improve, for Hypertension and cholesterol recheck.  Counseling provided for all of the vaccine components Orders Placed This Encounter  Procedures   CBC with Differential/Platelet    CMP14+EGFR   Lipid panel   PSA, total and free   Ambulatory referral to Gastroenterology    Caryl Pina, MD [facility] 10/21/2022, 1:36 PM

## 2022-10-22 LAB — CBC WITH DIFFERENTIAL/PLATELET
Basophils Absolute: 0.1 10*3/uL (ref 0.0–0.2)
Basos: 1 %
EOS (ABSOLUTE): 0.1 10*3/uL (ref 0.0–0.4)
Eos: 1 %
Hematocrit: 50.1 % (ref 37.5–51.0)
Hemoglobin: 16.6 g/dL (ref 13.0–17.7)
Immature Grans (Abs): 0 10*3/uL (ref 0.0–0.1)
Immature Granulocytes: 0 %
Lymphocytes Absolute: 0.6 10*3/uL — ABNORMAL LOW (ref 0.7–3.1)
Lymphs: 5 %
MCH: 29.2 pg (ref 26.6–33.0)
MCHC: 33.1 g/dL (ref 31.5–35.7)
MCV: 88 fL (ref 79–97)
Monocytes Absolute: 1.1 10*3/uL — ABNORMAL HIGH (ref 0.1–0.9)
Monocytes: 9 %
Neutrophils Absolute: 9.9 10*3/uL — ABNORMAL HIGH (ref 1.4–7.0)
Neutrophils: 84 %
Platelets: 200 10*3/uL (ref 150–450)
RBC: 5.68 x10E6/uL (ref 4.14–5.80)
RDW: 13 % (ref 11.6–15.4)
WBC: 11.9 10*3/uL — ABNORMAL HIGH (ref 3.4–10.8)

## 2022-10-22 LAB — LIPID PANEL
Chol/HDL Ratio: 2.7 ratio (ref 0.0–5.0)
Cholesterol, Total: 96 mg/dL — ABNORMAL LOW (ref 100–199)
HDL: 35 mg/dL — ABNORMAL LOW (ref >39)
LDL Chol Calc (NIH): 44 mg/dL (ref 0–99)
Triglycerides: 86 mg/dL (ref 0–149)
VLDL Cholesterol Cal: 17 mg/dL (ref 5–40)

## 2022-10-22 LAB — CMP14+EGFR
ALT: 30 IU/L (ref 0–44)
AST: 29 IU/L (ref 0–40)
Albumin/Globulin Ratio: 1.9 (ref 1.2–2.2)
Albumin: 4.3 g/dL (ref 3.8–4.9)
Alkaline Phosphatase: 104 IU/L (ref 44–121)
BUN/Creatinine Ratio: 16 (ref 9–20)
BUN: 17 mg/dL (ref 6–24)
Bilirubin Total: 0.9 mg/dL (ref 0.0–1.2)
CO2: 22 mmol/L (ref 20–29)
Calcium: 8.9 mg/dL (ref 8.7–10.2)
Chloride: 103 mmol/L (ref 96–106)
Creatinine, Ser: 1.05 mg/dL (ref 0.76–1.27)
Globulin, Total: 2.3 g/dL (ref 1.5–4.5)
Glucose: 99 mg/dL (ref 70–99)
Potassium: 4.1 mmol/L (ref 3.5–5.2)
Sodium: 144 mmol/L (ref 134–144)
Total Protein: 6.6 g/dL (ref 6.0–8.5)
eGFR: 82 mL/min/{1.73_m2} (ref >59)

## 2022-10-22 LAB — PSA, TOTAL AND FREE
PSA, Free Pct: 43.3 %
PSA, Free: 0.13 ng/mL
Prostate Specific Ag, Serum: 0.3 ng/mL (ref 0.0–4.0)

## 2022-10-23 ENCOUNTER — Encounter: Payer: No Typology Code available for payment source | Admitting: Vascular Surgery

## 2022-11-26 ENCOUNTER — Other Ambulatory Visit: Payer: Self-pay

## 2022-11-26 DIAGNOSIS — I71019 Dissection of thoracic aorta, unspecified: Secondary | ICD-10-CM

## 2022-12-18 ENCOUNTER — Encounter: Payer: No Typology Code available for payment source | Admitting: Vascular Surgery

## 2022-12-23 ENCOUNTER — Ambulatory Visit (HOSPITAL_COMMUNITY)
Admission: RE | Admit: 2022-12-23 | Discharge: 2022-12-23 | Disposition: A | Discharge status: Home / Self Care | Payer: No Typology Code available for payment source | Source: Ambulatory Visit | Attending: Vascular Surgery | Admitting: Vascular Surgery

## 2022-12-23 DIAGNOSIS — I71019 Dissection of thoracic aorta, unspecified: Secondary | ICD-10-CM | POA: Insufficient documentation

## 2022-12-23 MED ORDER — IOHEXOL 350 MG/ML SOLN
100.0000 mL | Freq: Once | INTRAVENOUS | Status: AC | PRN
  Administered 2022-12-23: 100 mL via INTRAVENOUS

## 2022-12-25 ENCOUNTER — Encounter: Payer: Self-pay | Admitting: Vascular Surgery

## 2022-12-25 ENCOUNTER — Ambulatory Visit (INDEPENDENT_AMBULATORY_CARE_PROVIDER_SITE_OTHER): Payer: No Typology Code available for payment source | Admitting: Vascular Surgery

## 2022-12-25 VITALS — BP 128/75 | HR 50 | Temp 98.0°F | Resp 20 | Ht 73.0 in | Wt 299.0 lb

## 2022-12-25 DIAGNOSIS — I71019 Dissection of thoracic aorta, unspecified: Secondary | ICD-10-CM

## 2022-12-25 NOTE — Progress Notes (Signed)
Patient ID: Jeffrey Flynn, male   DOB: 1960-08-05, 62 y.o.   MRN: [id number]  Reason for Consult: Routine Post Op   Referred by Dettinger, Elige Radon, MD  Subjective:     HPI:  Jeffrey Flynn is a 62 y.o. 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  Patient continues to work.  He does not have any new chest or abdominal pain.  He does have some swelling of his legs.  Remains on aspirin and statin.  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 Cliffton Asters)   Family History  Problem Relation Age of Onset   Hypertension Mother    Heart attack Mother    Bone cancer Father    Ovarian cancer Sister    Stroke Brother    Heart attack Maternal Grandmother    Prostate cancer Maternal Grandfather    Diabetes type II Paternal Grandfather    Cancer Brother        lung   Neuropathy Brother    Cancer Brother        unknown   Hypertension Brother    Hypertension Brother    Hypertension Sister    Hypertension Sister    Hernia Sister    Hypertension Sister    Hypertension Sister    Diabetes Son        amputee   Past Surgical History:  Procedure Laterality Date   Carotid Dopplers  09/2019   bilateral cervical carotids with no dissection or significant atherosclerosis.  Antegrade flow in left vertebral artery.  Right vertebral artery has bidirectional flow and small caliber.  Bilateral scalene arteries are stenotic with flow disturbance.  There is only a 12 mm pressure difference in both arms   EYE SURGERY Bilateral    cataracts    THORACIC AORTIC ANEURYSM REPAIR N/A 03/24/2019   Procedure: THORACIC ASCENDING ANEURYSM REPAIR (AAA);  Surgeon: Corliss Skains, MD;  Location: [facility] OR;  Service: Open Heart Surgery;   Laterality: N/A;   TRANSESOPHAGEAL ECHOCARDIOGRAM  03/24/2019   Intra-OP: EF 55-50%. No RWMA.  Non-concentric LVH.  Pre-surgical: Ascending Aorta noted dissection present w/ mobilization flap of Aortic Root, NOT involving Sinus of Valsalva. 70% of cross-sectional area of descending TAA. POST-REPAIR: NO AI (normal AO-Valve)   TRANSTHORACIC ECHOCARDIOGRAM  09/2019   Post aortic root replacement with valve sparing: EF 55 to 60%.  There is inferior basal hypokinesis but otherwise normal wall motion.  Normal diastolic parameters.  Normal right heart with mild RA dilation.  Aortic valve annular thickening noted from resuspension of aortic valve.  No regurgitation noted.  Turbulent flow noted in the arch post anastomosis.    Short Social History:  Social History   Tobacco Use   Smoking status: Former    Packs/day: 1.00    Years: 5.00    Additional pack years: 0.00    Total pack years: 5.00    Types: Cigarettes, Cigars    Quit date: 03/26/2009    Years since quitting: 13.7   Smokeless tobacco: Never  Substance Use Topics   Alcohol use: Never    No Known Allergies  Current Outpatient Medications  Medication Sig Dispense Refill  amLODipine (NORVASC) 10 MG tablet Take 1 tablet (10 mg total) by mouth daily. 90 tablet 3   aspirin EC 81 MG tablet Take 1 tablet (81 mg total) by mouth daily. 90 tablet 3   atorvastatin (LIPITOR) 40 MG tablet TAKE 1 TABLET BY MOUTH ONCE DAILY AT  6  PM 90 tablet 3   lisinopril (ZESTRIL) 40 MG tablet Take 1 tablet (40 mg total) by mouth daily. 90 tablet 3   metoprolol tartrate (LOPRESSOR) 100 MG tablet Take 1 tablet (100 mg total) by mouth 2 (two) times daily. 180 tablet 3   mupirocin ointment (BACTROBAN) 2 % Place 1 application into the nose 2 (two) times daily. 22 g 0   oxyCODONE-acetaminophen (PERCOCET) 5-325 MG tablet Take 1-2 tablets by mouth every 6 (six) hours as needed. 20 tablet 0   No current facility-administered medications for this visit.    Review of  Systems  Constitutional:  Constitutional negative. HENT: HENT negative.  Eyes: Eyes negative.  Respiratory: Respiratory negative.  Cardiovascular: Positive for leg swelling.  GI: Gastrointestinal negative.  Musculoskeletal: Musculoskeletal negative.  Skin: Skin negative.  Neurological: Neurological negative. Hematologic: Hematologic/lymphatic negative.  Psychiatric: Psychiatric negative.        Objective:  Objective   Vitals:   12/25/22 1554  BP: 128/75  Pulse: (!) 50  Resp: 20  Temp: 98 F (36.7 C)  SpO2: 92%  Weight: 299 lb (135.6 kg)  Height: 6\' 1"  (1.854 m)   Body mass index is 39.45 kg/m.  Physical Exam HENT:     Head: Normocephalic.     Nose: Nose normal.  Eyes:     Pupils: Pupils are equal, round, and reactive to light.  Cardiovascular:     Rate and Rhythm: Normal rate.     Pulses: Normal pulses.  Pulmonary:     Effort: Pulmonary effort is normal.  Abdominal:     General: Abdomen is flat.  Musculoskeletal:     Cervical back: Normal range of motion and neck supple.     Right lower leg: Edema present.     Left lower leg: Edema present.  Skin:    General: Skin is warm.     Capillary Refill: Capillary refill takes less than 2 seconds.  Neurological:     General: No focal deficit present.     Mental Status: He is alert.  Psychiatric:        Mood and Affect: Mood normal.        Behavior: Behavior normal.        Thought Content: Thought content normal.        Judgment: Judgment normal.     Data: CT IMPRESSION: Redemonstration of surgical repair of type A dissection with resuspension of the aortic valve and hemi arch, with no complicating features at the surgical site. Unchanged appearance in the perfusion of both true lumen and false lumen, as above.   Developing post dissection aneurysm of the thoracic aorta and abdominal aorta:   -5.2 cm estimated in the proximal aortic arch   -4.5 cm estimated diameter in the distal aortic  arch/proximal descending aorta   -estimated 3.8 cm in the juxta visceral abdominal aorta       Assessment/Plan:    62 year old male with history as above.  Continues to have dissection including the arch and the descending thoracic aorta with some increase in the diameter in the proximal arch of 5.2 cm.  Given history of open repair and difficulty of any endovascular repair moving forward  I have recommended repair when he reaches 6 cm.  I will have him follow-up in 1 year with repeat CT.  We discussed the signs and symptoms of symptomatic acute aortic syndrome and he demonstrates good understanding.  Short of this and we will see him in 1 year.     Maeola Harman MD [facility]

## 2023-01-27 IMAGING — CT CT ANGIO CHEST
4 of 8 series · 17 of 36 positions shown · IV contrast (iopamidol)
Comparison: Baseline 03/24/2019, 04/09/2019, 04/21/2020

CLINICAL DATA: 59-year-old male with a history of type a
dissection, treated 03/25/2019 with hemi arch repair and native
valve resuspension

EXAM:
CT ANGIOGRAPHY CHEST WITH CONTRAST
TECHNIQUE: Multidetector CT imaging of the chest was performed using the
standard protocol during bolus administration of intravenous
contrast. Multiplanar CT image reconstructions and MIPs were
obtained to evaluate the vascular anatomy.
CONTRAST:  75mL A0PZF2-V5Q IOPAMIDOL (A0PZF2-V5Q) INJECTION 76%

[Series 7: chest w/o 2.00 br40 s3 axial · axial · non-contrast · 0.76mm/px · z∈[+1750,+1916]mm · 4 of 139 slices shown]
[im 28/139  lung]
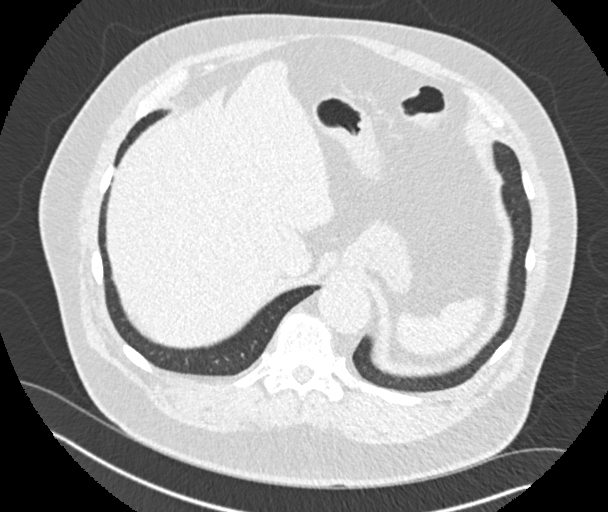
[im 56/139  lung]
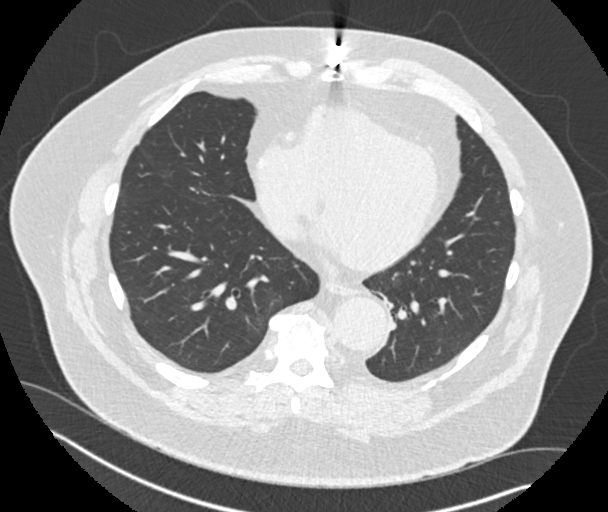
[im 83/139  lung]
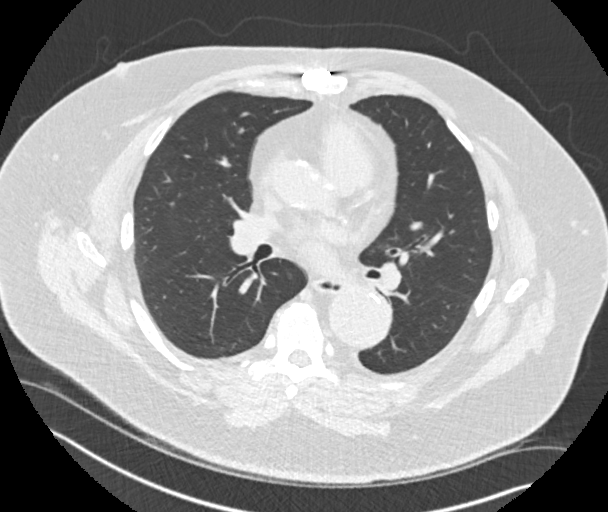
[im 111/139  lung]
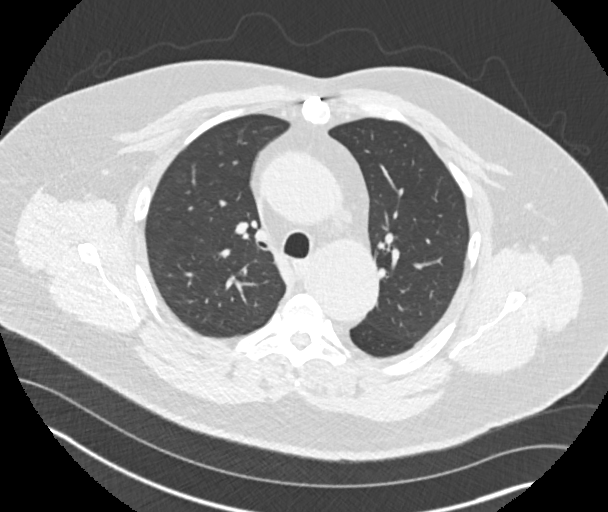

[Series 11: cta thorax 2.00 bv36 s3 axial arterial · axial · arterial · 0.90mm/px · z∈[+1711,+1971]mm · 6 of 184 slices shown]
[im 27/184  lung]
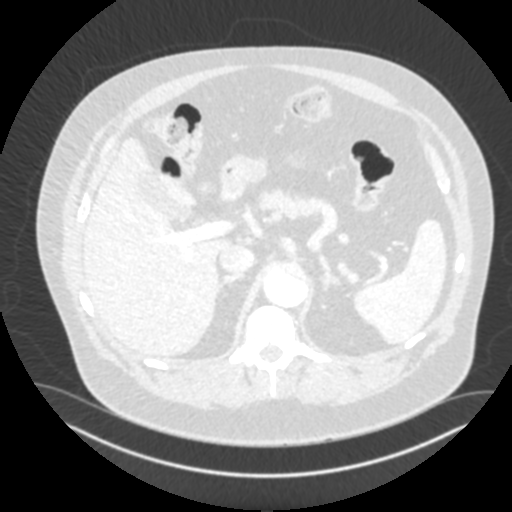
[im 53/184  mediastinal]
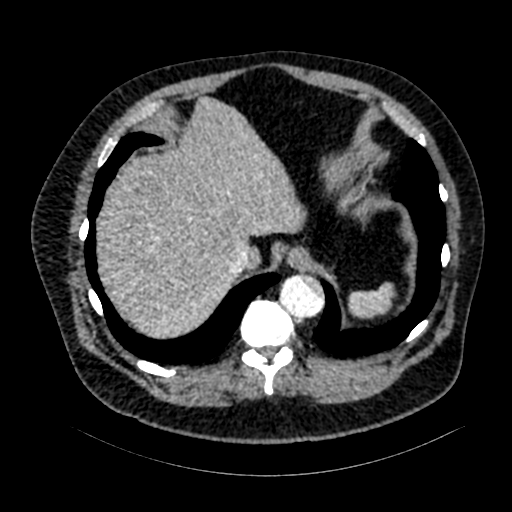
[im 79/184  lung]
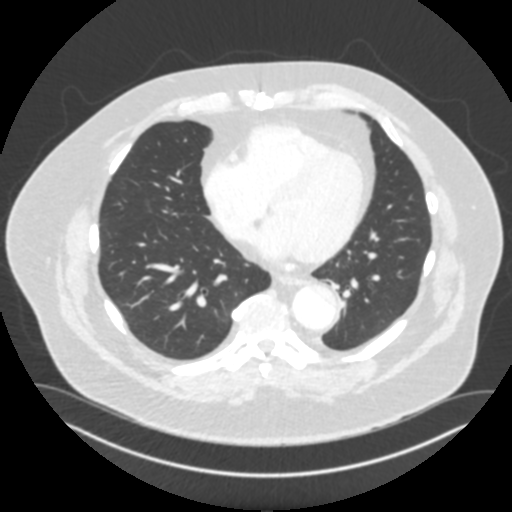
[im 105/184  mediastinal]
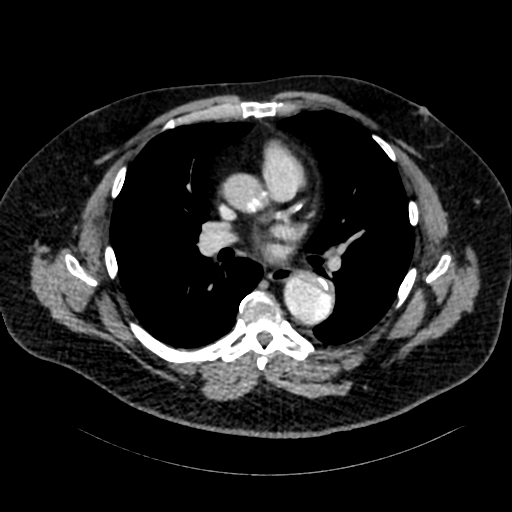
[im 131/184  lung]
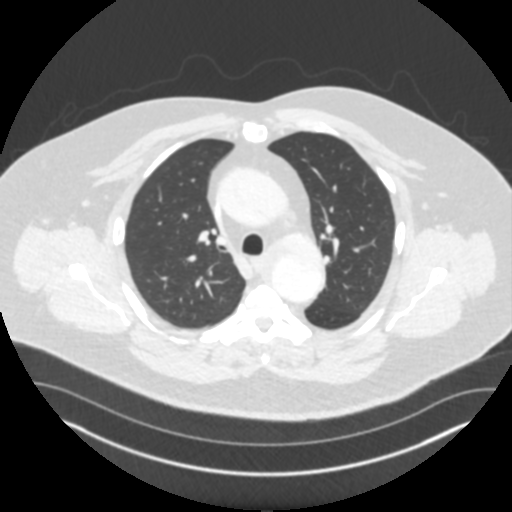
[im 157/184  mediastinal]
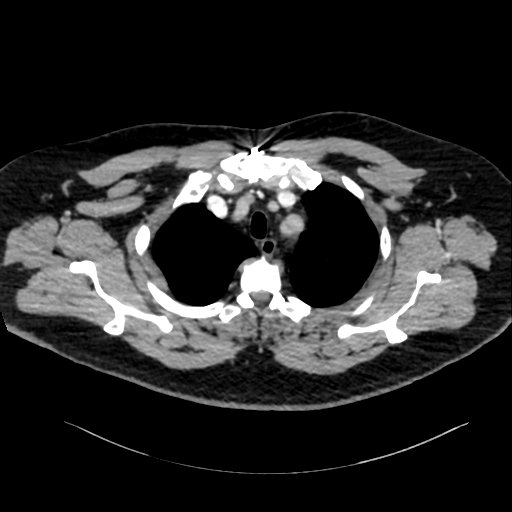

[Series 13: cta thorax 2.00 br60 s3 axial arterial · axial · arterial · 0.90mm/px · z∈[+1711,+1971]mm · 6 of 184 slices shown]
[im 27/184  lung]
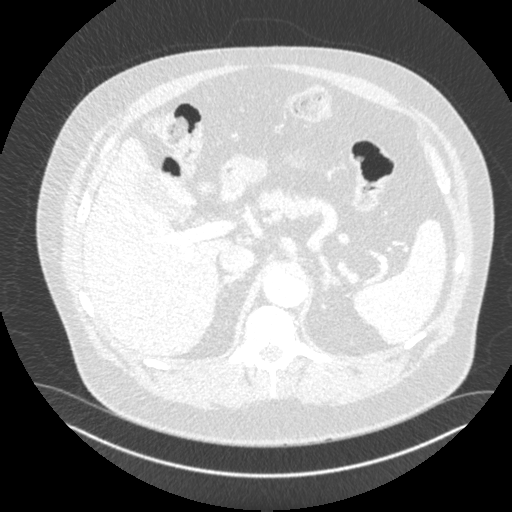
[im 53/184  lung]
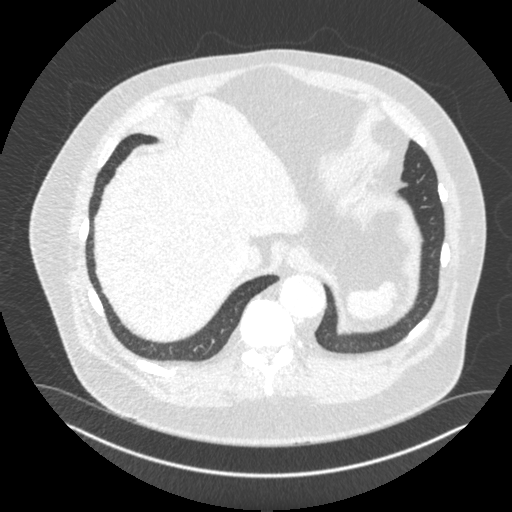
[im 79/184  lung]
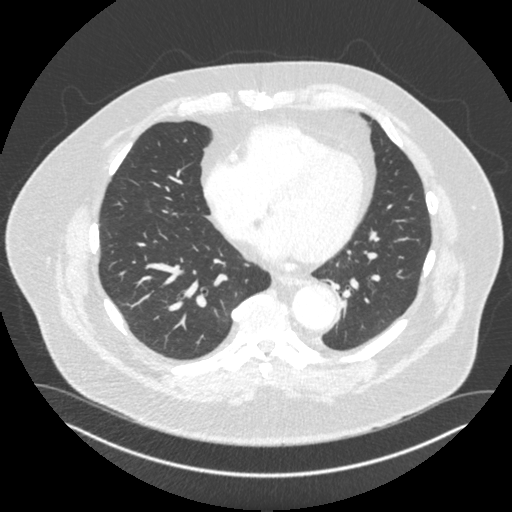
[im 105/184  lung]
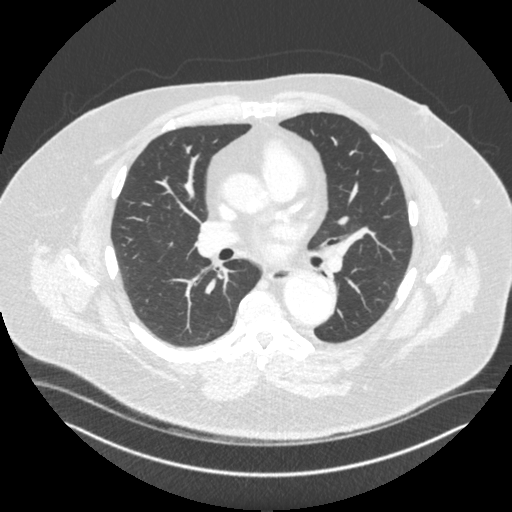
[im 131/184  lung]
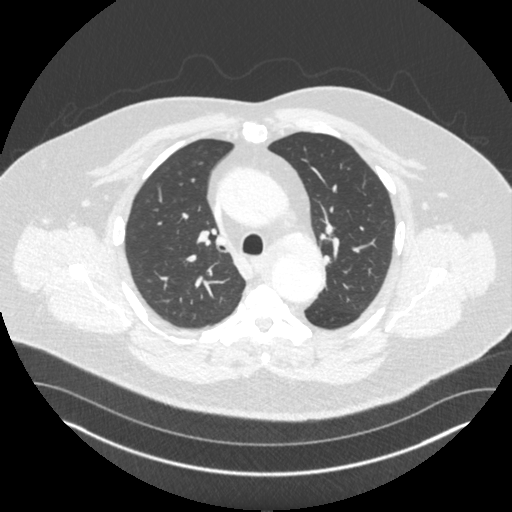
[im 157/184  lung]
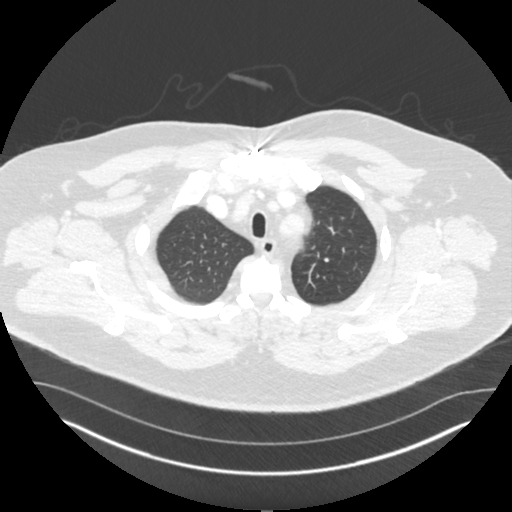

[Series 16: cta thorax 2.00 bv36 s3 cor st · coronal · 0.72mm/px · 1 of 229 slices shown]
[im 115/229  mediastinal]
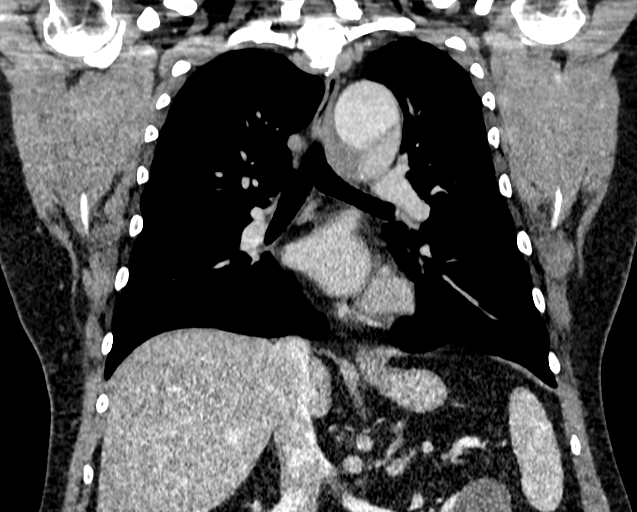

[17 of 36 positions shown; findings below may reference images not displayed]

FINDINGS: Cardiovascular:

Heart:

No cardiomegaly. No pericardial fluid/thickening. Calcifications of
left main, left anterior descending, circumflex, right coronary
arteries.

Aorta:

Minimal aortic valve calcifications.

Surgical changes of median sternotomy, hemi arch repair of type a
dissection, with native valve resuspension. Proximal anastomosis at
the sinotubular junction unremarkable.

The distal anastomosis is unremarkable, just proximal to the
innominate artery origin. There is fairly uniform perfusion of the
false lumen and the true lumen.

Estimated greatest diameter of the proximal aortic arch 47 mm,
previously on the baseline CT 44 mm.

There is relatively uniform perfusion of the branch arteries. The
timing of the contrast bolus somewhat limits evaluation of the
cervical cerebral vessels, however these appear patent.

The dissection flap again terminates within the proximal right
subclavian artery.

Dissection flap appears to terminate within the proximal left
subclavian artery.

There is differential contrast enhancement/perfusion of the true
lumen and the false lumen within the distal thoracic aorta, with the
false lumen having a preferential enhancement pattern. Dissection
flap continues below the lowest extent of the exam into the abdomen.

Greatest diameter of the proximal descending thoracic aorta 5.3 cm,
which measured 4.3 cm on the baseline CT of 03/24/2019.

No periaortic fluid or inflammatory changes.

Pulmonary arteries:

Timing of the contrast bolus is not optimized for evaluation of
pulmonary artery filling defects.

Mediastinum/Nodes: Surgical changes of median sternotomy. No
adenopathy.

Unremarkable appearance of the thoracic esophagus.

Lungs/Pleura: Central airways are clear. No pleural effusion. No
confluent airspace disease.

No pneumothorax.

Similar appearance of questionable 3 mm nodule of the left lower
lobe, with no new or increasing size of lung nodules.

Upper Abdomen: No acute.

Musculoskeletal: No acute finding of the upper abdomen. Similar
appearance of the cyst superior left kidney.

Review of the MIP images confirms the above findings.
IMPRESSION: Redemonstration postsurgical changes for repair of type A
dissection, including hemi arch repair, resuspension of the aortic
valve. No complicating features at the surgical site, although with
developing post dissection aneurysm of the proximal descending
thoracic aorta, estimated today 5.3 cm, and on the baseline CT
cm. There is preferential perfusion of the false channel on the CT
scan.

Similar appearance of questionable left lower lobe lung nodule.
Attention on future follow-up studies recommended.

## 2023-04-23 ENCOUNTER — Encounter (INDEPENDENT_AMBULATORY_CARE_PROVIDER_SITE_OTHER): Payer: Self-pay | Admitting: *Deleted

## 2023-04-23 ENCOUNTER — Encounter: Payer: Self-pay | Admitting: Family Medicine

## 2023-04-23 ENCOUNTER — Ambulatory Visit (INDEPENDENT_AMBULATORY_CARE_PROVIDER_SITE_OTHER): Payer: No Typology Code available for payment source | Admitting: Family Medicine

## 2023-04-23 VITALS — BP 135/70 | HR 54 | Ht 73.0 in | Wt 302.0 lb

## 2023-04-23 DIAGNOSIS — I10 Essential (primary) hypertension: Secondary | ICD-10-CM | POA: Diagnosis not present

## 2023-04-23 DIAGNOSIS — Z1211 Encounter for screening for malignant neoplasm of colon: Secondary | ICD-10-CM

## 2023-04-23 DIAGNOSIS — E785 Hyperlipidemia, unspecified: Secondary | ICD-10-CM | POA: Diagnosis not present

## 2023-04-23 NOTE — Progress Notes (Signed)
BP 135/70   Pulse (!) 54   Ht 6\' 1"  (1.854 m)   Wt (!) 302 lb (137 kg)   SpO2 94%   BMI 39.84 kg/m    Subjective:   Patient ID: Jeffrey Flynn, male    DOB: Apr 25, 1961, 62 y.o.   MRN: [id number]  HPI: Jeffrey Flynn is a 62 y.o. male presenting on 04/23/2023 for Medical Management of Chronic Issues, Hyperlipidemia, and Hypertension   HPI Hypertension Patient is currently on amlodipine and lisinopril and metoprolol, and their blood pressure today is 135/70. Patient denies any lightheadedness or dizziness. Patient denies headaches, blurred vision, chest pains, shortness of breath, or weakness. Denies any side effects from medication and is content with current medication.   Hyperlipidemia Patient is coming in for recheck of his hyperlipidemia. The patient is currently taking atorvastatin. They deny any issues with myalgias or history of liver damage from it. They deny any focal numbness or weakness or chest pain.   Relevant past medical, surgical, family and social history reviewed and updated as indicated. Interim medical history since our last visit reviewed. Allergies and medications reviewed and updated.  Review of Systems  Constitutional:  Negative for chills and fever.  Eyes:  Negative for visual disturbance.  Respiratory:  Negative for shortness of breath and wheezing.   Cardiovascular:  Negative for chest pain and leg swelling.  Musculoskeletal:  Negative for back pain and gait problem.  Skin:  Negative for rash.  Neurological:  Negative for dizziness, weakness and light-headedness.  All other systems reviewed and are negative.   Per HPI unless specifically indicated above   Allergies as of 04/23/2023   No Known Allergies      Medication List        Accurate as of April 23, 2023  1:39 PM. If you have any questions, ask your nurse or doctor.          amLODipine 10 MG tablet Commonly known as: NORVASC Take 1 tablet (10 mg total) by mouth daily.   aspirin EC  81 MG tablet Take 1 tablet (81 mg total) by mouth daily.   atorvastatin 40 MG tablet Commonly known as: LIPITOR TAKE 1 TABLET BY MOUTH ONCE DAILY AT  6  PM   lisinopril 40 MG tablet Commonly known as: ZESTRIL Take 1 tablet (40 mg total) by mouth daily.   metoprolol tartrate 100 MG tablet Commonly known as: LOPRESSOR Take 1 tablet (100 mg total) by mouth 2 (two) times daily.   mupirocin ointment 2 % Commonly known as: Bactroban Place 1 application into the nose 2 (two) times daily.   oxyCODONE-acetaminophen 5-325 MG tablet Commonly known as: Percocet Take 1-2 tablets by mouth every 6 (six) hours as needed.         Objective:   BP 135/70   Pulse (!) 54   Ht 6\' 1"  (1.854 m)   Wt (!) 302 lb (137 kg)   SpO2 94%   BMI 39.84 kg/m   Wt Readings from Last 3 Encounters:  04/23/23 (!) 302 lb (137 kg)  12/25/22 299 lb (135.6 kg)  10/21/22 291 lb 9.6 oz (132.3 kg)    Physical Exam Vitals and nursing note reviewed.  Constitutional:      General: He is not in acute distress.    Appearance: He is well-developed. He is not diaphoretic.  Eyes:     General: No scleral icterus.    Conjunctiva/sclera: Conjunctivae normal.  Neck:     Thyroid: No thyromegaly.  Cardiovascular:     Rate and Rhythm: Normal rate and regular rhythm.     Heart sounds: Normal heart sounds. No murmur heard. Pulmonary:     Effort: Pulmonary effort is normal. No respiratory distress.     Breath sounds: Normal breath sounds. No wheezing.  Musculoskeletal:        General: No swelling. Normal range of motion.     Cervical back: Neck supple.  Lymphadenopathy:     Cervical: No cervical adenopathy.  Skin:    General: Skin is warm and dry.     Findings: No rash.  Neurological:     Mental Status: He is alert and oriented to person, place, and time.     Coordination: Coordination normal.  Psychiatric:        Behavior: Behavior normal.       Assessment & Plan:   Problem List Items Addressed This  Visit       Cardiovascular and Mediastinum   Essential hypertension (Chronic)   Relevant Orders   CBC with Differential/Platelet   CMP14+EGFR   Lipid panel     Other   Dyslipidemia, goal LDL below 70 (Chronic)   Relevant Orders   CBC with Differential/Platelet   CMP14+EGFR   Lipid panel   Other Visit Diagnoses     Colon cancer screening    -  Primary   Relevant Orders   Cologuard       Continue current medicine, did not want to do the blood work today and will come back and do it in the next week or 2.. . Follow up plan: Return in about 6 months (around 10/22/2023), or if symptoms worsen or fail to improve, for Hypertension and hyperlipidemia.  Counseling provided for all of the vaccine components Orders Placed This Encounter  Procedures   Cologuard   CBC with Differential/Platelet   CMP14+EGFR   Lipid panel    Arville Care, MD [facility] 04/23/2023, 1:39 PM

## 2023-04-30 ENCOUNTER — Other Ambulatory Visit: Payer: Self-pay | Admitting: Thoracic Surgery (Cardiothoracic Vascular Surgery)

## 2023-04-30 DIAGNOSIS — I7101 Dissection of ascending aorta: Secondary | ICD-10-CM

## 2023-05-05 ENCOUNTER — Encounter: Payer: Self-pay | Admitting: Thoracic Surgery (Cardiothoracic Vascular Surgery)

## 2023-06-02 ENCOUNTER — Ambulatory Visit
Admission: RE | Admit: 2023-06-02 | Discharge: 2023-06-02 | Disposition: A | Discharge status: Home / Self Care | Payer: No Typology Code available for payment source | Source: Ambulatory Visit | Attending: Thoracic Surgery (Cardiothoracic Vascular Surgery) | Admitting: Thoracic Surgery (Cardiothoracic Vascular Surgery)

## 2023-06-02 DIAGNOSIS — I7101 Dissection of ascending aorta: Secondary | ICD-10-CM

## 2023-06-02 MED ORDER — IOPAMIDOL (ISOVUE-370) INJECTION 76%
200.0000 mL | Freq: Once | INTRAVENOUS | Status: AC | PRN
  Administered 2023-06-02: 75 mL via INTRAVENOUS

## 2023-06-10 ENCOUNTER — Ambulatory Visit (INDEPENDENT_AMBULATORY_CARE_PROVIDER_SITE_OTHER): Payer: No Typology Code available for payment source | Admitting: Physician Assistant

## 2023-06-10 ENCOUNTER — Encounter: Payer: Self-pay | Admitting: Physician Assistant

## 2023-06-10 VITALS — BP 137/68 | HR 51 | Resp 20 | Ht 73.0 in | Wt 300.0 lb

## 2023-06-10 DIAGNOSIS — I7101 Dissection of ascending aorta: Secondary | ICD-10-CM

## 2023-06-10 NOTE — Progress Notes (Signed)
[address]       [place] [address]             [phone]        Jeffrey Flynn [id number] [birth year]  History of Present Illness: Jeffrey Flynn has a past medical history of hypertension and type A aortic dissection. He returns for further surveillance after having had a right artery cannulation with an 8mm Hemashield graft, anterograde cerebral perfusion with circulatory arrest, ascending aortic repair with hemi-arch, aortic valve re-suspension, and right femoral arterial line placement by Dr. Tyrone Sage and Dr. Cliffton Asters on 03/25/2019. 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   The patient denies chest pain, chest tightness, dyspnea, dizziness and loss of consciousness. He does admit to swelling in his right leg after standing for long periods of time especially while working.    Current Outpatient Medications on File Prior to Visit  Medication Sig Dispense Refill   amLODipine (NORVASC) 10 MG tablet Take 1 tablet (10 mg total) by mouth daily. 90 tablet 3   aspirin EC 81 MG tablet Take 1 tablet (81 mg total) by mouth daily. 90 tablet 3   atorvastatin (LIPITOR) 40 MG tablet TAKE 1 TABLET BY MOUTH ONCE DAILY AT  6  PM 90 tablet 3   lisinopril (ZESTRIL) 40 MG tablet Take 1 tablet (40 mg total) by mouth daily. 90 tablet 3   metoprolol tartrate (LOPRESSOR) 100 MG tablet Take 1 tablet (100 mg total) by mouth 2 (two) times daily. 180 tablet 3   mupirocin ointment (BACTROBAN) 2 % Place 1 application into the nose 2 (two) times daily. 22 g 0   oxyCODONE-acetaminophen (PERCOCET) 5-325 MG tablet Take 1-2 tablets by mouth every 6 (six) hours as needed. 20 tablet 0   No current facility-administered medications on file prior to visit.   Vitals: Today's Vitals   06/10/23 1333  BP: 137/68  Pulse: (!) 51  Resp: 20  SpO2: 96%  Weight: 300 lb (136.1 kg)  Height: 6\' 1"  (1.854 m)   Body mass  index is 39.58 kg/m.  Physical Exam General: Alert and oriented, no acute distress Neuro: Grossly intact CV: Regular rate and rhythm, no murmur Pulm: Clear to auscultation bilaterally GI: +BS, nontender Extremities: 2+ radial pulses, 1+ RLE, no edema of the LLE  CTA Results: CLINICAL DATA:  Aortic aneurysm   F/U TAA Hx dissection of ascending aorta   EXAM: CT ANGIOGRAPHY CHEST WITH CONTRAST   TECHNIQUE: Multidetector CT imaging of the chest was performed using the standard protocol during bolus administration of intravenous contrast. Multiplanar CT image reconstructions and MIPs were obtained to evaluate the vascular anatomy.   RADIATION DOSE REDUCTION: This exam was performed according to the departmental dose-optimization program which includes automated exposure control, adjustment of the mA and/or kV according to patient size and/or use of iterative reconstruction technique.   CONTRAST:  75mL ISOVUE-370 IOPAMIDOL (ISOVUE-370) INJECTION 76%   COMPARISON:  CTA chest, 12/23/2022, 06/03/2022 and 03/24/2019 (dissection time stamp).   FINDINGS: CARDIOVASCULAR:   Limitations by motion: Mild   Preferential opacification of the thoracic aorta.   Similar post surgical changes of graft repair for ascending thoracic aortic aneurysm with intact proximal and distal anastomoses.   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   Persistent dissection flap at the distal ascending thoracic aorta  with similar extension to the proximal RIGHT innominate and LEFT subclavian arteries.   The dissection flap within the descending thoracic aorta is partially thrombosed medially, but extends inferiorly into the imaged abdominal aorta.   The celiac axis, SMA and RIGHT renal artery arise from the true lumen.   Other:   Normal heart size.  No pericardial effusion.   Mediastinum/Nodes:  No enlarged mediastinal, hilar, or axillary lymph nodes. Thyroid gland, trachea, and esophagus demonstrate no significant findings.   Lungs/Pleura: Stable Sub-6 mm perifissural nodularity. No follow-up is indicated. Lungs are otherwise clear without focal consolidation, mass or suspicious pulmonary nodule. No pleural effusion or pneumothorax.   Upper Abdomen: No acute abnormality.   Musculoskeletal: No acute chest wall abnormality. Median sternotomy change. No acute osseous abnormality.   Review of the MIP images confirms the above findings.   IMPRESSION: Since CTA chest dated 12/23/2022;   1. Open graft repair for ascending thoracic aortic (type A) dissection with stable post dissection aneurysmal dilatation at the arch and descending thoracic aorta. Measurements as above. 2. No CTA evidence of malperfusion, or acute superimposed intrathoracic vascular findings.   Jeffrey Banning, MD   [facility]   [facility]     Electronically Signed   By: Jeffrey Flynn M.D.   On: 06/02/2023 17:25  Impression and Plan: Hx of aortic dissection s/p repair in 2020: CTA shows a stable post dissection 5.2cm aneurysmal dilatation at the arch and persistent dissection flap at the distal ascending thoracic aorta with similar extension to the proximal RIGHT innominate and LEFT subclavian arteries. Last year CTA measured the arch at 5cm and the descending aorta at 5.5cm. Overall remains stable. We discussed importance of blood pressure control, avoiding fluoroquinolones and avoiding heavy lifting. He quit smoking years ago. We reviewed the signs and symptoms of aortic dissection and when to present to the ED. We will continue surveillance with CTA in 1 year. As discussed with Dr. Cliffton Asters, he will likely need a debranching if he undergoes another surgery, will continue to follow closely along with vascular surgery.  Aortic arch and descending aortic  aneurysm: Measuring 5.2cm at the arch and 4.7cm at the descending thoracic aorta. Per Dr. Darcella Cheshire last note on 12/25/22 they recommend repair when he reaches 6cm and they will continue annual surveillance.   HTN: Has overall been well controlled on current BP medications  Risk Modification:  Statin:  Atorvastatin  Smoking cessation instruction/counseling given: Quit smoking in 2008  Patient was counseled on importance of Blood Pressure Control.  Despite Medical intervention if the patient notices persistently elevated blood pressure readings.  They are instructed to contact their Primary Care Physician  Please avoid use of Fluoroquinolones as this can potentially increase your risk of Aortic Rupture and/or Dissection  Patient educated on signs and symptoms of Aortic Dissection, handout also provided in AVS  Jenny Reichmann, PA-C 06/10/23

## 2023-06-10 NOTE — Patient Instructions (Signed)

## 2023-10-18 ENCOUNTER — Other Ambulatory Visit: Payer: Self-pay | Admitting: Family Medicine

## 2023-10-18 DIAGNOSIS — I10 Essential (primary) hypertension: Secondary | ICD-10-CM

## 2023-10-22 ENCOUNTER — Encounter: Payer: Self-pay | Admitting: Family Medicine

## 2023-10-22 ENCOUNTER — Ambulatory Visit (INDEPENDENT_AMBULATORY_CARE_PROVIDER_SITE_OTHER): Payer: No Typology Code available for payment source | Admitting: Family Medicine

## 2023-10-22 VITALS — BP 130/71 | HR 53 | Ht 73.0 in | Wt 311.0 lb

## 2023-10-22 DIAGNOSIS — E785 Hyperlipidemia, unspecified: Secondary | ICD-10-CM

## 2023-10-22 DIAGNOSIS — I10 Essential (primary) hypertension: Secondary | ICD-10-CM | POA: Diagnosis not present

## 2023-10-22 MED ORDER — LISINOPRIL 40 MG PO TABS: 40.0000 mg | ORAL_TABLET | Freq: Every day | ORAL | 3 refills | Status: AC

## 2023-10-22 MED ORDER — ATORVASTATIN CALCIUM 40 MG PO TABS: ORAL_TABLET | ORAL | 3 refills | Status: AC

## 2023-10-22 MED ORDER — AMLODIPINE BESYLATE 10 MG PO TABS: 10.0000 mg | ORAL_TABLET | Freq: Every day | ORAL | 3 refills | Status: AC

## 2023-10-22 MED ORDER — METOPROLOL TARTRATE 100 MG PO TABS: 100.0000 mg | ORAL_TABLET | Freq: Two times a day (BID) | ORAL | 3 refills | Status: AC

## 2023-10-22 NOTE — Progress Notes (Signed)
 BP 130/71   Pulse (!) 53   Ht 6\' 1"  (1.854 m)   Wt (!) 311 lb (141.1 kg)   SpO2 95%   BMI 41.03 kg/m    Subjective:   Patient ID: Jeffrey Flynn, male    DOB: May 11, 1961, 63 y.o.   MRN: [id number]  HPI: Jeffrey Flynn is a 63 y.o. male presenting on 10/22/2023 for Medical Management of Chronic Issues, Hyperlipidemia, and Hypertension   HPI Hypertension Patient is currently on amlodipine and lisinopril and metoprolol, and their blood pressure today is 130/71. Patient denies any lightheadedness or dizziness. Patient denies headaches, blurred vision, chest pains, shortness of breath, or weakness. Denies any side effects from medication and is content with current medication.   Hyperlipidemia Patient is coming in for recheck of his hyperlipidemia. The patient is currently taking atorvastatin. They deny any issues with myalgias or history of liver damage from it. They deny any focal numbness or weakness or chest pain.   Patient does have some edema in his legs and it has gradually worsened over the years.  He does say he is on his feet on concrete surfaces a lot.  It does mostly still go down at night  Patient's hypertension and cholesterol are more complicated by the patient's morbid obesity.  Discussed weight loss and lifestyle modification and exercise with the patient.   Relevant past medical, surgical, family and social history reviewed and updated as indicated. Interim medical history since our last visit reviewed. Allergies and medications reviewed and updated.  Review of Systems  Constitutional:  Negative for chills and fever.  Eyes:  Negative for visual disturbance.  Respiratory:  Negative for shortness of breath and wheezing.   Cardiovascular:  Positive for leg swelling. Negative for chest pain.  Musculoskeletal:  Negative for back pain and gait problem.  Skin:  Negative for rash.  Neurological:  Negative for dizziness and light-headedness.  All other systems reviewed and are  negative.   Per HPI unless specifically indicated above   Allergies as of 10/22/2023   No Known Allergies      Medication List        Accurate as of October 22, 2023  1:13 PM. If you have any questions, ask your nurse or doctor.          STOP taking these medications    mupirocin ointment 2 % Commonly known as: Bactroban Stopped by: Elige Radon Jonavon Trieu   oxyCODONE-acetaminophen 5-325 MG tablet Commonly known as: Percocet Stopped by: Elige Radon Knut Rondinelli       TAKE these medications    amLODipine 10 MG tablet Commonly known as: NORVASC Take 1 tablet (10 mg total) by mouth daily.   aspirin EC 81 MG tablet Take 1 tablet (81 mg total) by mouth daily.   atorvastatin 40 MG tablet Commonly known as: LIPITOR TAKE 1 TABLET BY MOUTH ONCE DAILY AT  6  PM   lisinopril 40 MG tablet Commonly known as: ZESTRIL Take 1 tablet (40 mg total) by mouth daily.   metoprolol tartrate 100 MG tablet Commonly known as: LOPRESSOR Take 1 tablet (100 mg total) by mouth 2 (two) times daily.         Objective:   BP 130/71   Pulse (!) 53   Ht 6\' 1"  (1.854 m)   Wt (!) 311 lb (141.1 kg)   SpO2 95%   BMI 41.03 kg/m   Wt Readings from Last 3 Encounters:  10/22/23 (!) 311 lb (141.1 kg)  06/10/23 300  lb (136.1 kg)  04/23/23 (!) 302 lb (137 kg)    Physical Exam Vitals and nursing note reviewed.  Constitutional:      General: He is not in acute distress.    Appearance: He is well-developed. He is not diaphoretic.  Eyes:     General: No scleral icterus.    Conjunctiva/sclera: Conjunctivae normal.  Neck:     Thyroid: No thyromegaly.  Cardiovascular:     Rate and Rhythm: Normal rate and regular rhythm.     Heart sounds: Normal heart sounds. No murmur heard. Pulmonary:     Effort: Pulmonary effort is normal. No respiratory distress.     Breath sounds: Normal breath sounds. No wheezing.  Musculoskeletal:        General: Swelling (1+ peripheral edema bilateral lower extremities)  present. Normal range of motion.     Cervical back: Neck supple.  Lymphadenopathy:     Cervical: No cervical adenopathy.  Skin:    General: Skin is warm and dry.     Findings: No rash.  Neurological:     Mental Status: He is alert and oriented to person, place, and time.     Coordination: Coordination normal.  Psychiatric:        Behavior: Behavior normal.       Assessment & Plan:   Problem List Items Addressed This Visit       Cardiovascular and Mediastinum   Essential hypertension (Chronic)   Relevant Medications   amLODipine (NORVASC) 10 MG tablet   atorvastatin (LIPITOR) 40 MG tablet   lisinopril (ZESTRIL) 40 MG tablet   metoprolol tartrate (LOPRESSOR) 100 MG tablet   Other Relevant Orders   CBC with Differential/Platelet   CMP14+EGFR   Lipid panel     Other   Dyslipidemia, goal LDL below 70 - Primary (Chronic)   Relevant Medications   amLODipine (NORVASC) 10 MG tablet   atorvastatin (LIPITOR) 40 MG tablet   lisinopril (ZESTRIL) 40 MG tablet   metoprolol tartrate (LOPRESSOR) 100 MG tablet   Other Relevant Orders   CBC with Differential/Platelet   CMP14+EGFR   Lipid panel   Morbid obesity (HCC) (Chronic)    Will check blood work today, blood pressure looks good today.  Peripheral edema is slightly worse and recommended compression socks during the daytime.   Follow up plan: Return in about 6 months (around 04/22/2024), or if symptoms worsen or fail to improve, for Physical exam and recheck hypertension cholesterol.  Counseling provided for all of the vaccine components Orders Placed This Encounter  Procedures   CBC with Differential/Platelet   CMP14+EGFR   Lipid panel    Arville Care, MD [facility] 10/22/2023, 1:13 PM

## 2023-10-23 LAB — CBC WITH DIFFERENTIAL/PLATELET
Basophils Absolute: 0.1 10*3/uL (ref 0.0–0.2)
Basos: 2 %
EOS (ABSOLUTE): 0.4 10*3/uL (ref 0.0–0.4)
Eos: 5 %
Hematocrit: 47.9 % (ref 37.5–51.0)
Hemoglobin: 15.9 g/dL (ref 13.0–17.7)
Immature Grans (Abs): 0 10*3/uL (ref 0.0–0.1)
Immature Granulocytes: 0 %
Lymphocytes Absolute: 1.9 10*3/uL (ref 0.7–3.1)
Lymphs: 26 %
MCH: 29.3 pg (ref 26.6–33.0)
MCHC: 33.2 g/dL (ref 31.5–35.7)
MCV: 88 fL (ref 79–97)
Monocytes Absolute: 1 10*3/uL — ABNORMAL HIGH (ref 0.1–0.9)
Monocytes: 14 %
Neutrophils Absolute: 4 10*3/uL (ref 1.4–7.0)
Neutrophils: 53 %
Platelets: 215 10*3/uL (ref 150–450)
RBC: 5.42 x10E6/uL (ref 4.14–5.80)
RDW: 13 % (ref 11.6–15.4)
WBC: 7.5 10*3/uL (ref 3.4–10.8)

## 2023-10-23 LAB — CMP14+EGFR
ALT: 29 IU/L (ref 0–44)
AST: 29 IU/L (ref 0–40)
Albumin: 4.1 g/dL (ref 3.8–4.9)
Alkaline Phosphatase: 97 IU/L (ref 44–121)
BUN/Creatinine Ratio: 15 (ref 10–24)
BUN: 15 mg/dL (ref 8–27)
Bilirubin Total: 0.6 mg/dL (ref 0.0–1.2)
CO2: 25 mmol/L (ref 20–29)
Calcium: 9.4 mg/dL (ref 8.6–10.2)
Chloride: 103 mmol/L (ref 96–106)
Creatinine, Ser: 1.03 mg/dL (ref 0.76–1.27)
Globulin, Total: 2.2 g/dL (ref 1.5–4.5)
Glucose: 126 mg/dL — ABNORMAL HIGH (ref 70–99)
Potassium: 4 mmol/L (ref 3.5–5.2)
Sodium: 141 mmol/L (ref 134–144)
Total Protein: 6.3 g/dL (ref 6.0–8.5)
eGFR: 83 mL/min/{1.73_m2} (ref >59)

## 2023-10-23 LAB — LIPID PANEL
Chol/HDL Ratio: 3.6 ratio (ref 0.0–5.0)
Cholesterol, Total: 97 mg/dL — ABNORMAL LOW (ref 100–199)
HDL: 27 mg/dL — ABNORMAL LOW (ref >39)
LDL Chol Calc (NIH): 46 mg/dL (ref 0–99)
Triglycerides: 132 mg/dL (ref 0–149)
VLDL Cholesterol Cal: 24 mg/dL (ref 5–40)

## 2023-10-31 ENCOUNTER — Encounter: Payer: Self-pay | Admitting: Family Medicine

## 2024-04-22 ENCOUNTER — Encounter: Payer: Self-pay | Admitting: Family Medicine

## 2024-04-22 ENCOUNTER — Ambulatory Visit: Admitting: Family Medicine

## 2024-04-22 VITALS — BP 104/57 | HR 91 | Temp 97.4°F | Ht 73.0 in | Wt 306.0 lb

## 2024-04-22 DIAGNOSIS — I10 Essential (primary) hypertension: Secondary | ICD-10-CM | POA: Diagnosis not present

## 2024-04-22 DIAGNOSIS — Z0001 Encounter for general adult medical examination with abnormal findings: Secondary | ICD-10-CM

## 2024-04-22 DIAGNOSIS — Z125 Encounter for screening for malignant neoplasm of prostate: Secondary | ICD-10-CM

## 2024-04-22 DIAGNOSIS — E785 Hyperlipidemia, unspecified: Secondary | ICD-10-CM | POA: Diagnosis not present

## 2024-04-22 DIAGNOSIS — Z0000 Encounter for general adult medical examination without abnormal findings: Secondary | ICD-10-CM

## 2024-04-22 LAB — LIPID PANEL

## 2024-04-22 NOTE — Progress Notes (Signed)
 BP (!) 104/57   Pulse 91   Temp (!) 97.4 F (36.3 C) (Temporal)   Ht 6' 1 (1.854 m)   Wt (!) 306 lb (138.8 kg)   SpO2 97%   BMI 40.37 kg/m    Subjective:   Patient ID: Jeffrey Flynn, male    DOB: [birth year], 63 y.o.   MRN: [id number]  HPI: Jeffrey Flynn is a 63 y.o. male presenting on 04/22/2024 for Annual Exam   Discussed the use of AI scribe software for clinical note transcription with the patient, who gave verbal consent to proceed.  History of Present Illness   Jeffrey Flynn is a 63 year old male who presents for an annual physical exam.  General health status - No current health concerns or symptoms - Attributes minor issues to aging  Hypertension management - Takes amlodipine , lisinopril , and metoprolol  for blood pressure control - Does not frequently monitor blood pressure at home  Hyperlipidemia management - Takes a cholesterol medication and baby aspirin  - No side effects from cholesterol medication  Cardiac history and symptoms - History of heart murmur, previously evaluated with ultrasound - Experiences intermittent swelling, attributed to activity since early morning, typically resolves by night - No associated symptoms reported  Colorectal cancer screening - No prior colonoscopy - Considering colonoscopy in the future, especially after son underwent procedure without issues          Relevant past medical, surgical, family and social history reviewed and updated as indicated. Interim medical history since our last visit reviewed. Allergies and medications reviewed and updated.  Review of Systems  Constitutional:  Negative for chills and fever.  HENT:  Negative for ear pain and tinnitus.   Eyes:  Negative for pain and visual disturbance.  Respiratory:  Negative for cough, shortness of breath and wheezing.   Cardiovascular:  Negative for chest pain, palpitations and leg swelling.  Gastrointestinal:  Negative for abdominal pain, blood in stool,  constipation and diarrhea.  Genitourinary:  Negative for dysuria and hematuria.  Musculoskeletal:  Positive for arthralgias. Negative for back pain, gait problem and myalgias.  Skin:  Negative for rash.  Neurological:  Negative for dizziness, weakness and headaches.  Psychiatric/Behavioral:  Negative for suicidal ideas.   All other systems reviewed and are negative.   Per HPI unless specifically indicated above   Allergies as of 04/22/2024   No Known Allergies      Medication List        Accurate as of April 22, 2024  2:14 PM. If you have any questions, ask your nurse or doctor.          amLODipine  10 MG tablet Commonly known as: NORVASC  Take 1 tablet (10 mg total) by mouth daily.   aspirin  EC 81 MG tablet Take 1 tablet (81 mg total) by mouth daily.   atorvastatin  40 MG tablet Commonly known as: LIPITOR TAKE 1 TABLET BY MOUTH ONCE DAILY AT  6  PM   lisinopril  40 MG tablet Commonly known as: ZESTRIL  Take 1 tablet (40 mg total) by mouth daily.   metoprolol  tartrate 100 MG tablet Commonly known as: LOPRESSOR  Take 1 tablet (100 mg total) by mouth 2 (two) times daily.         Objective:   BP (!) 104/57   Pulse 91   Temp (!) 97.4 F (36.3 C) (Temporal)   Ht 6' 1 (1.854 m)   Wt (!) 306 lb (138.8 kg)   SpO2 97%   BMI 40.37 kg/m  Wt Readings from Last 3 Encounters:  04/22/24 (!) 306 lb (138.8 kg)  10/22/23 (!) 311 lb (141.1 kg)  06/10/23 300 lb (136.1 kg)    Physical Exam Vitals and nursing note reviewed.  Constitutional:      Appearance: Normal appearance.  HENT:     Right Ear: Tympanic membrane normal.     Left Ear: Tympanic membrane normal.     Mouth/Throat:     Mouth: Mucous membranes are moist.     Pharynx: Oropharynx is clear. No oropharyngeal exudate or posterior oropharyngeal erythema.  Abdominal:     General: Abdomen is flat. Bowel sounds are normal. There is no distension.     Palpations: Abdomen is soft.     Tenderness: There is no  abdominal tenderness. There is no guarding or rebound.  Neurological:     Mental Status: He is alert.    Physical Exam   NECK: Thyroid  without nodules. CHEST: Lungs clear to auscultation bilaterally. CARDIOVASCULAR: Regular heart rate. EXTREMITIES: 2+ edema in extremities.         Assessment & Plan:   Problem List Items Addressed This Visit       Cardiovascular and Mediastinum   Essential hypertension (Chronic)   Relevant Orders   CBC with Differential/Platelet   CMP14+EGFR   Lipid panel     Other   Dyslipidemia, goal LDL below 70 (Chronic)   Relevant Orders   CMP14+EGFR   Lipid panel   Other Visit Diagnoses       Physical exam    -  Primary   Relevant Orders   CBC with Differential/Platelet   CMP14+EGFR   Lipid panel     Prostate cancer screening       Relevant Orders   PSA, total and free          Adult Wellness Visit Routine wellness visit with no acute concerns. Discussed health maintenance, screenings, and vaccinations. He declined flu and pneumonia vaccinations and deferred colonoscopy. - Consider future colonoscopy for colon cancer screening.  Lower extremity edema Two plus edema with nocturnal improvement. - Advise leg elevation when sitting.  Essential hypertension Hypertension well-controlled with current medications. Blood pressure readings are stable. - Continue amlodipine , lisinopril , and metoprolol . - Monitor blood pressure at home bi-monthly.  Hyperlipidemia Managed with current medication without side effects. - Continue current cholesterol medication.          Follow up plan: Return in about 6 months (around 10/21/2024), or if symptoms worsen or fail to improve, for Hypertension and hyperlipidemia recheck.  Counseling provided for all of the vaccine components Orders Placed This Encounter  Procedures   CBC with Differential/Platelet   CMP14+EGFR   Lipid panel   PSA, total and free    Fonda Levins, MD [facility] 04/22/2024, 2:14 PM

## 2024-04-23 LAB — CMP14+EGFR
ALT: 25 IU/L (ref 0–44)
AST: 28 IU/L (ref 0–40)
Albumin: 4 g/dL (ref 3.8–4.9)
Alkaline Phosphatase: 92 IU/L (ref 47–123)
BUN/Creatinine Ratio: 14 (ref 10–24)
BUN: 16 mg/dL (ref 8–27)
Bilirubin Total: 0.6 mg/dL (ref 0.0–1.2)
CO2: 24 mmol/L (ref 20–29)
Calcium: 9.2 mg/dL (ref 8.6–10.2)
Chloride: 101 mmol/L (ref 96–106)
Creatinine, Ser: 1.14 mg/dL (ref 0.76–1.27)
Globulin, Total: 2.3 g/dL (ref 1.5–4.5)
Glucose: 112 mg/dL — AB (ref 70–99)
Potassium: 3.9 mmol/L (ref 3.5–5.2)
Sodium: 139 mmol/L (ref 134–144)
Total Protein: 6.3 g/dL (ref 6.0–8.5)
eGFR: 74 mL/min/1.73 (ref >59)

## 2024-04-23 LAB — CBC WITH DIFFERENTIAL/PLATELET
Basophils Absolute: 0.1 x10E3/uL (ref 0.0–0.2)
Basos: 1 %
EOS (ABSOLUTE): 0.3 x10E3/uL (ref 0.0–0.4)
Eos: 4 %
Hematocrit: 47 % (ref 37.5–51.0)
Hemoglobin: 15.8 g/dL (ref 13.0–17.7)
Immature Grans (Abs): 0 x10E3/uL (ref 0.0–0.1)
Immature Granulocytes: 0 %
Lymphocytes Absolute: 1.9 x10E3/uL (ref 0.7–3.1)
Lymphs: 26 %
MCH: 29.7 pg (ref 26.6–33.0)
MCHC: 33.6 g/dL (ref 31.5–35.7)
MCV: 88 fL (ref 79–97)
Monocytes Absolute: 1 x10E3/uL — ABNORMAL HIGH (ref 0.1–0.9)
Monocytes: 14 %
Neutrophils Absolute: 4.2 x10E3/uL (ref 1.4–7.0)
Neutrophils: 55 %
Platelets: 215 x10E3/uL (ref 150–450)
RBC: 5.32 x10E6/uL (ref 4.14–5.80)
RDW: 13 % (ref 11.6–15.4)
WBC: 7.5 x10E3/uL (ref 3.4–10.8)

## 2024-04-23 LAB — LIPID PANEL
Cholesterol, Total: 99 mg/dL — AB (ref 100–199)
HDL: 27 mg/dL — AB (ref >39)
LDL CALC COMMENT:: 3.7 ratio (ref 0.0–5.0)
LDL Chol Calc (NIH): 39 mg/dL (ref 0–99)
Triglycerides: 206 mg/dL — AB (ref 0–149)
VLDL Cholesterol Cal: 33 mg/dL (ref 5–40)

## 2024-04-23 LAB — PSA, TOTAL AND FREE
PSA, Free Pct: 40 %
PSA, Free: 0.12 ng/mL
Prostate Specific Ag, Serum: 0.3 ng/mL (ref 0.0–4.0)

## 2024-04-28 ENCOUNTER — Ambulatory Visit: Payer: Self-pay | Admitting: Family Medicine

## 2024-05-05 ENCOUNTER — Other Ambulatory Visit: Payer: Self-pay | Admitting: Thoracic Surgery (Cardiothoracic Vascular Surgery)

## 2024-05-05 DIAGNOSIS — I7101 Dissection of ascending aorta: Secondary | ICD-10-CM

## 2024-06-07 ENCOUNTER — Ambulatory Visit (HOSPITAL_COMMUNITY): Admission: RE | Admit: 2024-06-07 | Source: Ambulatory Visit

## 2024-06-07 ENCOUNTER — Encounter (HOSPITAL_COMMUNITY): Payer: Self-pay

## 2024-06-14 ENCOUNTER — Ambulatory Visit

## 2024-10-21 ENCOUNTER — Ambulatory Visit: Payer: Self-pay | Admitting: Family Medicine
